# Patient Record
Sex: Male | Born: 1975 | Race: White | Hispanic: No | Marital: Married | Smoking: Current every day smoker
Health system: Southern US, Community
[De-identification: ages and names within clinical notes are randomized; demographics above are authoritative.]

## PROBLEM LIST (undated history)

## (undated) DIAGNOSIS — G8929 Other chronic pain: Secondary | ICD-10-CM

## (undated) DIAGNOSIS — M549 Dorsalgia, unspecified: Secondary | ICD-10-CM

## (undated) DIAGNOSIS — I10 Essential (primary) hypertension: Secondary | ICD-10-CM

## (undated) DIAGNOSIS — F1910 Other psychoactive substance abuse, uncomplicated: Secondary | ICD-10-CM

## (undated) DIAGNOSIS — F419 Anxiety disorder, unspecified: Secondary | ICD-10-CM

---

## 2010-08-30 ENCOUNTER — Emergency Department (HOSPITAL_BASED_OUTPATIENT_CLINIC_OR_DEPARTMENT_OTHER): Admission: EM | Admit: 2010-08-30 | Discharge: 2010-08-30 | Payer: Self-pay | Admitting: Emergency Medicine

## 2010-09-01 ENCOUNTER — Emergency Department (HOSPITAL_BASED_OUTPATIENT_CLINIC_OR_DEPARTMENT_OTHER): Admission: EM | Admit: 2010-09-01 | Discharge: 2010-09-02 | Payer: Self-pay | Admitting: Emergency Medicine

## 2010-09-11 ENCOUNTER — Emergency Department (HOSPITAL_BASED_OUTPATIENT_CLINIC_OR_DEPARTMENT_OTHER): Admission: EM | Admit: 2010-09-11 | Discharge: 2010-09-11 | Payer: Self-pay | Admitting: Emergency Medicine

## 2011-01-22 ENCOUNTER — Emergency Department (HOSPITAL_BASED_OUTPATIENT_CLINIC_OR_DEPARTMENT_OTHER)
Admission: EM | Admit: 2011-01-22 | Discharge: 2011-01-22 | Payer: Self-pay | Source: Home / Self Care | Admitting: Emergency Medicine

## 2011-03-01 ENCOUNTER — Emergency Department (HOSPITAL_BASED_OUTPATIENT_CLINIC_OR_DEPARTMENT_OTHER)
Admission: EM | Admit: 2011-03-01 | Discharge: 2011-03-01 | Disposition: A | Discharge status: Home / Self Care | Payer: Self-pay | Attending: Emergency Medicine | Admitting: Emergency Medicine

## 2011-03-01 DIAGNOSIS — K029 Dental caries, unspecified: Secondary | ICD-10-CM | POA: Insufficient documentation

## 2011-03-01 DIAGNOSIS — K089 Disorder of teeth and supporting structures, unspecified: Secondary | ICD-10-CM | POA: Insufficient documentation

## 2011-03-12 ENCOUNTER — Emergency Department (HOSPITAL_BASED_OUTPATIENT_CLINIC_OR_DEPARTMENT_OTHER)
Admission: EM | Admit: 2011-03-12 | Discharge: 2011-03-12 | Disposition: A | Discharge status: Home / Self Care | Payer: Self-pay | Attending: Emergency Medicine | Admitting: Emergency Medicine

## 2011-03-12 DIAGNOSIS — R6884 Jaw pain: Secondary | ICD-10-CM | POA: Insufficient documentation

## 2011-03-12 DIAGNOSIS — K1370 Unspecified lesions of oral mucosa: Secondary | ICD-10-CM | POA: Insufficient documentation

## 2011-03-12 DIAGNOSIS — F17200 Nicotine dependence, unspecified, uncomplicated: Secondary | ICD-10-CM | POA: Insufficient documentation

## 2011-05-03 ENCOUNTER — Emergency Department (HOSPITAL_BASED_OUTPATIENT_CLINIC_OR_DEPARTMENT_OTHER)
Admission: EM | Admit: 2011-05-03 | Discharge: 2011-05-03 | Disposition: A | Discharge status: Home / Self Care | Payer: Self-pay | Attending: Emergency Medicine | Admitting: Emergency Medicine

## 2011-05-03 DIAGNOSIS — F17200 Nicotine dependence, unspecified, uncomplicated: Secondary | ICD-10-CM | POA: Insufficient documentation

## 2011-05-03 DIAGNOSIS — Y92009 Unspecified place in unspecified non-institutional (private) residence as the place of occurrence of the external cause: Secondary | ICD-10-CM | POA: Insufficient documentation

## 2011-05-03 DIAGNOSIS — M545 Low back pain, unspecified: Secondary | ICD-10-CM | POA: Insufficient documentation

## 2011-05-03 DIAGNOSIS — X58XXXA Exposure to other specified factors, initial encounter: Secondary | ICD-10-CM | POA: Insufficient documentation

## 2011-05-03 DIAGNOSIS — B360 Pityriasis versicolor: Secondary | ICD-10-CM | POA: Insufficient documentation

## 2011-05-03 DIAGNOSIS — S335XXA Sprain of ligaments of lumbar spine, initial encounter: Secondary | ICD-10-CM | POA: Insufficient documentation

## 2011-07-13 ENCOUNTER — Emergency Department (HOSPITAL_BASED_OUTPATIENT_CLINIC_OR_DEPARTMENT_OTHER)
Admission: EM | Admit: 2011-07-13 | Discharge: 2011-07-13 | Disposition: A | Discharge status: Home / Self Care | Payer: Medicaid Other | Attending: Emergency Medicine | Admitting: Emergency Medicine

## 2011-07-13 ENCOUNTER — Encounter: Payer: Self-pay | Admitting: Emergency Medicine

## 2011-07-13 DIAGNOSIS — T148XXA Other injury of unspecified body region, initial encounter: Secondary | ICD-10-CM

## 2011-07-13 DIAGNOSIS — F17200 Nicotine dependence, unspecified, uncomplicated: Secondary | ICD-10-CM | POA: Insufficient documentation

## 2011-07-13 DIAGNOSIS — S335XXA Sprain of ligaments of lumbar spine, initial encounter: Secondary | ICD-10-CM | POA: Insufficient documentation

## 2011-07-13 DIAGNOSIS — Y9311 Activity, swimming: Secondary | ICD-10-CM | POA: Insufficient documentation

## 2011-07-13 DIAGNOSIS — X500XXA Overexertion from strenuous movement or load, initial encounter: Secondary | ICD-10-CM | POA: Insufficient documentation

## 2011-07-13 HISTORY — DX: Dorsalgia, unspecified: M54.9

## 2011-07-13 MED ORDER — CYCLOBENZAPRINE HCL 10 MG PO TABS: 10.0000 mg | ORAL_TABLET | Freq: Two times a day (BID) | ORAL | Status: AC | PRN

## 2011-07-13 MED ORDER — IBUPROFEN 800 MG PO TABS: 800.0000 mg | ORAL_TABLET | Freq: Three times a day (TID) | ORAL | Status: AC

## 2011-07-13 MED ORDER — HYDROCODONE-ACETAMINOPHEN 5-325 MG PO TABS: 1.0000 | ORAL_TABLET | Freq: Four times a day (QID) | ORAL | Status: AC | PRN

## 2011-07-13 NOTE — ED Notes (Signed)
No PCP 

## 2011-07-13 NOTE — ED Provider Notes (Signed)
History      Chief Complaint  Patient presents with  . Back Pain  Reports going swimming all day yesterday. States his left lower back has been spasming since then. Reports similar symptoms in the past.  Patient is a 35 y.o. male presenting with back pain. The history is provided by the patient.  Back Pain  This is a new problem. Episode onset: yesterday. The problem occurs constantly. The problem has been gradually worsening. The pain is associated with no known injury. Pain location: Right left lower back. The quality of the pain is described as aching. The pain does not radiate. The pain is moderate. The symptoms are aggravated by twisting (palpation). Pertinent negatives include no chest pain, no numbness, no abdominal pain, no bowel incontinence, no perianal numbness, no bladder incontinence, no dysuria, no leg pain, no paresthesias, no paresis, no tingling and no weakness. He has tried nothing for the symptoms.    Past Medical History  Diagnosis Date  . Back pain     History reviewed. No pertinent past surgical history.  No family history on file.  History  Substance Use Topics  . Smoking status: Current Some Day Smoker  . Smokeless tobacco: Not on file  . Alcohol Use: No      Review of Systems  Cardiovascular: Negative for chest pain.  Gastrointestinal: Negative for abdominal pain and bowel incontinence.  Genitourinary: Negative for bladder incontinence and dysuria.  Musculoskeletal: Positive for back pain.  Neurological: Negative for tingling, weakness, numbness and paresthesias.  All other systems reviewed and are negative.    Physical Exam  BP 138/71  Pulse 75  Temp(Src) 98.8 F (37.1 C) (Oral)  Resp 16  SpO2 100%  Physical Exam  Constitutional: He is oriented to person, place, and time. He appears well-developed and well-nourished.  HENT:  Head: Normocephalic and atraumatic.  Eyes: Conjunctivae are normal. Pupils are equal, round, and reactive to light.   Neck: Normal range of motion. Neck supple.  Cardiovascular: Normal rate, regular rhythm and normal heart sounds.   Pulmonary/Chest: Effort normal and breath sounds normal.  Abdominal: Soft. Bowel sounds are normal. There is no tenderness. There is no rebound and no guarding.  Neurological: He is alert and oriented to person, place, and time.  Skin: Skin is warm and dry. No rash noted. No erythema. No pallor.  Psychiatric: He has a normal mood and affect. His behavior is normal.  Spine: no midline tenderness. Neg SLR. No lumbar paraspinal tenderness. Pain with palpation of left lower lumbar area.   ED Course  Procedures  MDM      Thomasene Lot, [place] 07/13/11 1443  Evaluation and management procedures were performed by the mid-level provider (PA/NP/CNM) under my supervision/collaboration.   Vida Roller, MD 07/13/11 [phone]

## 2011-08-01 ENCOUNTER — Emergency Department (HOSPITAL_BASED_OUTPATIENT_CLINIC_OR_DEPARTMENT_OTHER)
Admission: EM | Admit: 2011-08-01 | Discharge: 2011-08-01 | Disposition: A | Discharge status: Home / Self Care | Payer: Medicaid Other | Attending: Emergency Medicine | Admitting: Emergency Medicine

## 2011-08-01 ENCOUNTER — Encounter (HOSPITAL_BASED_OUTPATIENT_CLINIC_OR_DEPARTMENT_OTHER): Payer: Self-pay | Admitting: *Deleted

## 2011-08-01 DIAGNOSIS — M549 Dorsalgia, unspecified: Secondary | ICD-10-CM | POA: Insufficient documentation

## 2011-08-01 DIAGNOSIS — K047 Periapical abscess without sinus: Secondary | ICD-10-CM

## 2011-08-01 MED ORDER — PENICILLIN V POTASSIUM 500 MG PO TABS: 500.0000 mg | ORAL_TABLET | Freq: Four times a day (QID) | ORAL | Status: AC

## 2011-08-01 MED ORDER — HYDROCODONE-ACETAMINOPHEN 7.5-500 MG/15ML PO SOLN: ORAL | Status: DC

## 2011-08-01 MED ORDER — BUPIVACAINE HCL 0.25 % IJ SOLN
5.0000 mL | Freq: Once | INTRAMUSCULAR | Status: AC
  Administered 2011-08-01: 5 mL

## 2011-08-01 MED ORDER — IBUPROFEN 800 MG PO TABS: 800.0000 mg | ORAL_TABLET | Freq: Three times a day (TID) | ORAL | Status: AC

## 2011-08-01 NOTE — ED Notes (Signed)
Dental pain. Left lower molar pain.

## 2011-08-01 NOTE — ED Provider Notes (Signed)
History     CSN: [id number] Arrival date & time: 08/01/2011  6:48 PM  Chief Complaint  Patient presents with  . Dental Pain   Patient is a 35 y.o. male presenting with tooth pain. The history is provided by the patient.  Dental PainThe primary symptoms include mouth pain. Primary symptoms do not include dental injury, headaches, fever or shortness of breath. The symptoms began yesterday. The symptoms are worsening. The symptoms are new. The symptoms occur constantly.  Additional symptoms do not include: trismus, facial swelling, trouble swallowing, pain with swallowing, excessive salivation, drooling, ear pain, nosebleeds and swollen glands. Medical issues include: smoking.  L l ower molar pain states he has a DDS appointment in one week. No F/C, No N/V.   Past Medical History  Diagnosis Date  . Back pain     History reviewed. No pertinent past surgical history.  History reviewed. No pertinent family history.  History  Substance Use Topics  . Smoking status: Current Some Day Smoker  . Smokeless tobacco: Not on file  . Alcohol Use: No      Review of Systems  Constitutional: Negative for fever and chills.  HENT: Negative for ear pain, nosebleeds, facial swelling, drooling, trouble swallowing, neck pain and neck stiffness.   Eyes: Negative for pain.  Respiratory: Negative for shortness of breath.   Cardiovascular: Negative for chest pain.  Gastrointestinal: Negative for abdominal pain.  Genitourinary: Negative for dysuria.  Musculoskeletal: Negative for back pain.  Skin: Negative for rash.  Neurological: Negative for headaches.  All other systems reviewed and are negative.    Physical Exam  BP 128/71  Pulse 71  Temp(Src) 99.5 F (37.5 C) (Oral)  Resp 17  Ht 5\' 10"  (1.778 m)  Wt 145 lb (65.772 kg)  BMI 20.81 kg/m2  SpO2 98%  Physical Exam  Constitutional: He is oriented to person, place, and time. He appears well-developed and well-nourished.  HENT:  Head:  Normocephalic and atraumatic.       TTP L 3rd molar, no gingival swelling or fluctuance, no facial swelling or erythema, no trismus  Eyes: Conjunctivae and EOM are normal. Pupils are equal, round, and reactive to light.  Neck: Trachea normal and full passive range of motion without pain. Neck supple. No thyromegaly present.       No meningismus  Cardiovascular: Normal rate, regular rhythm, S1 normal, S2 normal, intact distal pulses and normal pulses.     No systolic murmur is present   No diastolic murmur is present  Pulses:      Radial pulses are 2+ on the right side, and 2+ on the left side.  Pulmonary/Chest: Effort normal and breath sounds normal. He has no wheezes. He has no rhonchi. He has no rales. He exhibits no tenderness.  Abdominal: Soft. Normal appearance and bowel sounds are normal. There is no tenderness. There is no CVA tenderness and negative Murphy's sign.  Musculoskeletal: Normal range of motion.       BLE:s Calves nontender, no cords or erythema, negative Homans sign  Neurological: He is alert and oriented to person, place, and time. He has normal strength and normal reflexes. No cranial nerve deficit or sensory deficit. He displays a negative Romberg sign. GCS eye subscore is 4. GCS verbal subscore is 5. GCS motor subscore is 6.       Normal Gait  Skin: Skin is warm and dry. No rash noted. He is not diaphoretic. No cyanosis. Nails show no clubbing.  Psychiatric: He has  a normal mood and affect. His speech is normal and behavior is normal.       Cooperative and appropriate    ED Course  Dental Date/Time: 08/01/2011 7:59 PM Performed by: Sunnie Nielsen Authorized by: Sunnie Nielsen Consent: Verbal consent obtained. Risks and benefits: risks, benefits and alternatives were discussed Consent given by: patient Patient understanding: patient states understanding of the procedure being performed Time out: Immediately prior to procedure a "time out" was called to verify the  correct patient, procedure, equipment, support staff and site/side marked as required. Local anesthesia used: yes Anesthesia: local infiltration Local anesthetic: bupivacaine 0.25% without epinephrine Anesthetic total: 2 ml Patient tolerance: Patient tolerated the procedure well with no immediate complications.    MDM Dental pain possible abscess, treated for same with local injection, RX pain meds and ABx      Sunnie Nielsen, MD 08/01/11 2000

## 2011-10-05 ENCOUNTER — Encounter (HOSPITAL_BASED_OUTPATIENT_CLINIC_OR_DEPARTMENT_OTHER): Payer: Self-pay | Admitting: Emergency Medicine

## 2011-10-05 ENCOUNTER — Emergency Department (HOSPITAL_BASED_OUTPATIENT_CLINIC_OR_DEPARTMENT_OTHER)
Admission: EM | Admit: 2011-10-05 | Discharge: 2011-10-05 | Disposition: A | Discharge status: Home / Self Care | Payer: Medicaid Other | Attending: Emergency Medicine | Admitting: Emergency Medicine

## 2011-10-05 DIAGNOSIS — S335XXA Sprain of ligaments of lumbar spine, initial encounter: Secondary | ICD-10-CM | POA: Insufficient documentation

## 2011-10-05 DIAGNOSIS — IMO0002 Reserved for concepts with insufficient information to code with codable children: Secondary | ICD-10-CM | POA: Insufficient documentation

## 2011-10-05 DIAGNOSIS — S39012A Strain of muscle, fascia and tendon of lower back, initial encounter: Secondary | ICD-10-CM

## 2011-10-05 DIAGNOSIS — F17200 Nicotine dependence, unspecified, uncomplicated: Secondary | ICD-10-CM | POA: Insufficient documentation

## 2011-10-05 MED ORDER — METHOCARBAMOL 500 MG PO TABS: 500.0000 mg | ORAL_TABLET | Freq: Two times a day (BID) | ORAL | Status: AC

## 2011-10-05 MED ORDER — HYDROCODONE-ACETAMINOPHEN 5-325 MG PO TABS: 2.0000 | ORAL_TABLET | ORAL | Status: AC | PRN

## 2011-10-05 NOTE — ED Provider Notes (Signed)
History     CSN: [id number] Arrival date & time: 10/05/2011  9:11 AM  Chief Complaint  Patient presents with  . Back Pain    Pt report hittin lower back on cross beam abrasion to site with muscle spasms    (Consider location/radiation/quality/duration/timing/severity/associated sxs/prior treatment) Patient is a 35 y.o. male presenting with back pain. The history is provided by the patient.  Back Pain    patient injured his back 3 days ago on the left flank area when he struck an object. Denies any weakness in his legs no hematuria. Symptoms are nonradiating worse when he moves around better with rest. Has used Motrin at home without relief of his symptoms. Does have a prior history of back injury. Denies any spinal tenderness  Past Medical History  Diagnosis Date  . Back pain     History reviewed. No pertinent past surgical history.  History reviewed. No pertinent family history.  History  Substance Use Topics  . Smoking status: Current Some Day Smoker  . Smokeless tobacco: Not on file  . Alcohol Use: No      Review of Systems  Musculoskeletal: Positive for back pain.  All other systems reviewed and are negative.    Allergies  Review of patient's allergies indicates no known allergies.  Home Medications   Current Outpatient Rx  Name Route Sig Dispense Refill  . HYDROCODONE-ACETAMINOPHEN 7.5-500 MG/15ML PO SOLN  7.70ml PO QHS as needed for severe pain. No driving while medicated. 60 mL 0  . IBUPROFEN 200 MG PO TABS Oral Take 600 mg by mouth 2 (two) times daily as needed. For pain        BP 127/70  Pulse 65  Temp 97.6 F (36.4 C)  Resp 22  SpO2 100%  Physical Exam  Nursing note and vitals reviewed. Constitutional: He is oriented to person, place, and time. Vital signs are normal. He appears well-developed and well-nourished.  Non-toxic appearance.  HENT:  Head: Normocephalic and atraumatic.  Eyes: Conjunctivae are normal. Pupils are equal, round, and  reactive to light.  Neck: Normal range of motion.  Cardiovascular: Normal rate.   Pulmonary/Chest: Effort normal.  Musculoskeletal:       Right shoulder: He exhibits tenderness, swelling, pain and spasm. He exhibits no bony tenderness.       Arms: Neurological: He is alert and oriented to person, place, and time.  Skin: Skin is warm and dry.  Psychiatric: He has a normal mood and affect.    ED Course  Procedures (including critical care time)  Labs Reviewed - No data to display No results found.   No diagnosis found.    MDM  Patient to be treated with pain meds and muscle relaxants and followup with his doctors in the        Toy Baker, MD 10/05/11 [phone]

## 2011-10-05 NOTE — ED Notes (Signed)
Care Plan reviewed with pt and safe use of use.

## 2011-10-05 NOTE — ED Notes (Signed)
Pt reports lower back pain after injury on Thursday

## 2012-03-09 ENCOUNTER — Emergency Department (HOSPITAL_BASED_OUTPATIENT_CLINIC_OR_DEPARTMENT_OTHER)
Admission: EM | Admit: 2012-03-09 | Discharge: 2012-03-09 | Disposition: A | Discharge status: Home / Self Care | Payer: Medicaid Other | Attending: Emergency Medicine | Admitting: Emergency Medicine

## 2012-03-09 ENCOUNTER — Encounter (HOSPITAL_BASED_OUTPATIENT_CLINIC_OR_DEPARTMENT_OTHER): Payer: Self-pay | Admitting: *Deleted

## 2012-03-09 DIAGNOSIS — F17200 Nicotine dependence, unspecified, uncomplicated: Secondary | ICD-10-CM | POA: Insufficient documentation

## 2012-03-09 DIAGNOSIS — F411 Generalized anxiety disorder: Secondary | ICD-10-CM | POA: Insufficient documentation

## 2012-03-09 DIAGNOSIS — F419 Anxiety disorder, unspecified: Secondary | ICD-10-CM

## 2012-03-09 MED ORDER — ONDANSETRON 8 MG PO TBDP
8.0000 mg | ORAL_TABLET | Freq: Once | ORAL | Status: AC
  Administered 2012-03-09: 8 mg via ORAL
  Filled 2012-03-09: qty 1

## 2012-03-09 MED ORDER — LORAZEPAM 1 MG PO TABS: 2.0000 mg | ORAL_TABLET | Freq: Three times a day (TID) | ORAL | Status: AC | PRN

## 2012-03-09 MED ORDER — LORAZEPAM 1 MG PO TABS
2.0000 mg | ORAL_TABLET | Freq: Once | ORAL | Status: AC
  Administered 2012-03-09: 2 mg via ORAL
  Filled 2012-03-09: qty 2

## 2012-03-09 NOTE — ED Provider Notes (Signed)
History     CSN: [id number]  Arrival date & time 03/09/12  7829   First MD Initiated Contact with Patient 03/09/12 1956      Chief Complaint  Patient presents with  . Panic Attack    (Consider location/radiation/quality/duration/timing/severity/associated sxs/prior treatment) Patient is a 36 y.o. male presenting with anxiety. The history is provided by the patient.  Anxiety This is a new problem. The current episode started more than 2 days ago. The problem occurs constantly. The problem has been gradually worsening. Pertinent negatives include no chest pain, no abdominal pain and no headaches. Associated symptoms comments: Not sleeping, 19, abdominal burning. Has been extremely stressed since his wife was in the hospital progressed with his and his having to watch the children with no help. The symptoms are aggravated by stress. The symptoms are relieved by nothing. He has tried nothing for the symptoms. The treatment provided no relief.    Past Medical History  Diagnosis Date  . Back pain     History reviewed. No pertinent past surgical history.  History reviewed. No pertinent family history.  History  Substance Use Topics  . Smoking status: Current Some Day Smoker  . Smokeless tobacco: Not on file  . Alcohol Use: No      Review of Systems  Cardiovascular: Negative for chest pain.  Gastrointestinal: Negative for abdominal pain.  Neurological: Negative for headaches.  All other systems reviewed and are negative.    Allergies  Review of patient's allergies indicates no known allergies.  Home Medications   Current Outpatient Rx  Name Route Sig Dispense Refill  . IBUPROFEN 200 MG PO TABS Oral Take 200 mg by mouth 2 (two) times daily as needed. For pain     . OVER THE COUNTER MEDICATION Oral Take 2 tablets by mouth once as needed. Nasal congestion tablets      BP 106/83  Pulse 73  Temp(Src) 98.7 F (37.1 C) (Oral)  Resp 16  SpO2 100%  Physical Exam    Nursing note and vitals reviewed. Constitutional: He is oriented to person, place, and time. He appears well-developed and well-nourished. No distress.  HENT:  Head: Normocephalic and atraumatic.  Mouth/Throat: Oropharynx is clear and moist.  Eyes: Conjunctivae and EOM are normal. Pupils are equal, round, and reactive to light.  Neck: Normal range of motion. Neck supple.  Cardiovascular: Normal rate, regular rhythm and intact distal pulses.   No murmur heard. Pulmonary/Chest: Effort normal and breath sounds normal. No respiratory distress. He has no wheezes. He has no rales.  Abdominal: Soft. He exhibits no distension. There is no tenderness. There is no rebound and no guarding.  Musculoskeletal: Normal range of motion. He exhibits no edema and no tenderness.  Neurological: He is alert and oriented to person, place, and time.  Skin: Skin is warm and dry. No rash noted. No erythema.  Psychiatric: His behavior is normal. His mood appears anxious. He is not actively hallucinating. Thought content is not paranoid. He expresses no homicidal and no suicidal ideation.    ED Course  Procedures (including critical care time)  Labs Reviewed - No data to display No results found.   No diagnosis found.    MDM  Patient with generalized anxiety due to stress because of his wife's recent illness. He states that he is not eating or sleeping and is very terrible. He has a history of anxiety but does not take anything chronically for it. He denies suicide or homicide. He is not hallucinating.  After Ativan patient feels much better wants to go home.        Gwyneth Sprout, MD 03/09/12 2100

## 2012-03-09 NOTE — ED Notes (Signed)
Patient states that his wife has been admitted to intensive care and that he is having a panic attack, feels stressed and hasn't been able to sleep or eat for the past two days, states that he broke down and cried today. States he needs to sleep

## 2012-04-02 ENCOUNTER — Encounter (HOSPITAL_BASED_OUTPATIENT_CLINIC_OR_DEPARTMENT_OTHER): Payer: Self-pay | Admitting: Family Medicine

## 2012-04-02 ENCOUNTER — Emergency Department (HOSPITAL_BASED_OUTPATIENT_CLINIC_OR_DEPARTMENT_OTHER)
Admission: EM | Admit: 2012-04-02 | Discharge: 2012-04-02 | Disposition: A | Discharge status: Home / Self Care | Payer: Medicaid Other | Attending: Emergency Medicine | Admitting: Emergency Medicine

## 2012-04-02 DIAGNOSIS — I10 Essential (primary) hypertension: Secondary | ICD-10-CM | POA: Insufficient documentation

## 2012-04-02 DIAGNOSIS — F17200 Nicotine dependence, unspecified, uncomplicated: Secondary | ICD-10-CM | POA: Insufficient documentation

## 2012-04-02 DIAGNOSIS — F411 Generalized anxiety disorder: Secondary | ICD-10-CM | POA: Insufficient documentation

## 2012-04-02 DIAGNOSIS — G4700 Insomnia, unspecified: Secondary | ICD-10-CM | POA: Insufficient documentation

## 2012-04-02 DIAGNOSIS — F419 Anxiety disorder, unspecified: Secondary | ICD-10-CM

## 2012-04-02 HISTORY — DX: Essential (primary) hypertension: I10

## 2012-04-02 MED ORDER — LORAZEPAM 1 MG PO TABS
2.0000 mg | ORAL_TABLET | Freq: Once | ORAL | Status: AC
  Administered 2012-04-02: 2 mg via ORAL
  Filled 2012-04-02: qty 2

## 2012-04-02 MED ORDER — LORAZEPAM 1 MG PO TABS: 2.0000 mg | ORAL_TABLET | Freq: Three times a day (TID) | ORAL | Status: AC | PRN

## 2012-04-02 MED ORDER — LORAZEPAM 1 MG PO TABS: 1.0000 mg | ORAL_TABLET | Freq: Once | ORAL | Status: DC

## 2012-04-02 NOTE — ED Provider Notes (Signed)
History     CSN: [id number]  Arrival date & time 04/02/12  1308   First MD Initiated Contact with Patient 04/02/12 1335      Chief Complaint  Patient presents with  . Insomnia    (Consider location/radiation/quality/duration/timing/severity/associated sxs/prior treatment) HPI Comments: His had difficulty sleeping for the past 4-5 days. Has tried Benadryl, Seroquel without improvement. Had to stop taking trazodone secondary to side effects. He is currently on Suboxone for opiate use. Sent here from primary care physician for treatment. He has no suicidal or homicidal ideation. No additional symptoms to suggest mania.  Patient is a 36 y.o. male presenting with anxiety. The history is provided by the patient. No language interpreter was used.  Anxiety This is a new problem. Episode onset: 5 days ago. The problem occurs constantly. The problem has been gradually worsening. Pertinent negatives include no chest pain, no abdominal pain, no headaches and no shortness of breath. The symptoms are aggravated by nothing. The symptoms are relieved by nothing.    Past Medical History  Diagnosis Date  . Back pain   . Hypertension     History reviewed. No pertinent past surgical history.  No family history on file.  History  Substance Use Topics  . Smoking status: Current Some Day Smoker  . Smokeless tobacco: Not on file  . Alcohol Use: No      Review of Systems  Constitutional: Negative for fever, activity change, appetite change and fatigue.  HENT: Negative for congestion, sore throat, rhinorrhea, neck pain and neck stiffness.   Respiratory: Negative for cough and shortness of breath.   Cardiovascular: Negative for chest pain and palpitations.  Gastrointestinal: Negative for nausea, vomiting and abdominal pain.  Genitourinary: Negative for dysuria, urgency, frequency and flank pain.  Musculoskeletal: Negative for myalgias, back pain and arthralgias.  Neurological: Negative for  dizziness, weakness, light-headedness, numbness and headaches.  Psychiatric/Behavioral: Positive for sleep disturbance. Negative for suicidal ideas, hallucinations, confusion and self-injury. The patient is nervous/anxious.   All other systems reviewed and are negative.    Allergies  Review of patient's allergies indicates no known allergies.  Home Medications   Current Outpatient Rx  Name Route Sig Dispense Refill  . CLONIDINE HCL 0.1 MG PO TABS Oral Take 0.1 mg by mouth 2 (two) times daily.    . QUETIAPINE FUMARATE 300 MG PO TABS Oral Take 300 mg by mouth at bedtime.    Marland Kitchen UNABLE TO FIND  Suboxen 2mg     . IBUPROFEN 200 MG PO TABS Oral Take 200 mg by mouth 2 (two) times daily as needed. For pain     . LORAZEPAM 1 MG PO TABS Oral Take 2 tablets (2 mg total) by mouth 3 (three) times daily as needed for anxiety. 18 tablet 0  . OVER THE COUNTER MEDICATION Oral Take 2 tablets by mouth once as needed. Nasal congestion tablets      BP 149/84  Pulse 102  Temp(Src) 97.9 F (36.6 C) (Oral)  Resp 20  SpO2 98%  Physical Exam  Nursing note and vitals reviewed. Constitutional: He is oriented to person, place, and time. He appears well-developed and well-nourished. No distress.  HENT:  Head: Normocephalic and atraumatic.  Mouth/Throat: Oropharynx is clear and moist.  Eyes: Conjunctivae and EOM are normal. Pupils are equal, round, and reactive to light.  Neck: Normal range of motion. Neck supple.  Cardiovascular: Normal rate, normal heart sounds and intact distal pulses.  Exam reveals no gallop and no friction rub.  No murmur heard. Pulmonary/Chest: Effort normal and breath sounds normal. No respiratory distress. He exhibits no tenderness.  Abdominal: Soft. Bowel sounds are normal. There is no tenderness.  Musculoskeletal: Normal range of motion. He exhibits no tenderness.  Neurological: He is alert and oriented to person, place, and time. No cranial nerve deficit.  Skin: Skin is warm and  dry. No rash noted.  Psychiatric: His behavior is normal. His mood appears anxious. His speech is not rapid and/or pressured. He does not exhibit a depressed mood. He expresses no homicidal and no suicidal ideation.    ED Course  Procedures (including critical care time)  Labs Reviewed - No data to display No results found.   1. Anxiety   2. Insomnia       MDM  Insomnia likely secondary to anxiety. He is currently going through treatment for polysubstance abuse. Seroquel and trazodone without relief. Will be provided Ativan as this has helped in the past. Will be seeing his primary care physician on Monday for definitive plan. He has no thoughts of self harm or harm of others. Will be discharged home        Dayton Bailiff, MD 04/02/12 1353

## 2012-04-02 NOTE — ED Notes (Signed)
Pt sts he is unable to sleep since Monday. Pt sts he had to stop taking Trazodone last Sunday due to being in rehab. Pt sts he is at the [facility] as is followed by Dr. Mila Homer. Pt took first Seroquel 300mg  today and sts "it isn't helping". Pt denies SI/HI.

## 2012-04-02 NOTE — Discharge Instructions (Signed)
Insomnia Insomnia is frequent trouble falling and/or staying asleep. Insomnia can be a long term problem or a short term problem. Both are common. Insomnia can be a short term problem when the wakefulness is related to a certain stress or worry. Long term insomnia is often related to ongoing stress during waking hours and/or poor sleeping habits. Overtime, sleep deprivation itself can make the problem worse. Every little thing feels more severe because you are overtired and your ability to cope is decreased. CAUSES   Stress, anxiety, and depression.   Poor sleeping habits.   Distractions such as TV in the bedroom.   Naps close to bedtime.   Engaging in emotionally charged conversations before bed.   Technical reading before sleep.   Alcohol and other sedatives. They may make the problem worse. They can hurt normal sleep patterns and normal dream activity.   Stimulants such as caffeine for several hours prior to bedtime.   Pain syndromes and shortness of breath can cause insomnia.   Exercise late at night.   Changing time zones may cause sleeping problems (jet lag).  It is sometimes helpful to have someone observe your sleeping patterns. They should look for periods of not breathing during the night (sleep apnea). They should also look to see how long those periods last. If you live alone or observers are uncertain, you can also be observed at a sleep clinic where your sleep patterns will be professionally monitored. Sleep apnea requires a checkup and treatment. Give your caregivers your medical history. Give your caregivers observations your family has made about your sleep.  SYMPTOMS   Not feeling rested in the morning.   Anxiety and restlessness at bedtime.   Difficulty falling and staying asleep.  TREATMENT   Your caregiver may prescribe treatment for an underlying medical disorders. Your caregiver can give advice or help if you are using alcohol or other drugs for  self-medication. Treatment of underlying problems will usually eliminate insomnia problems.   Medications can be prescribed for short time use. They are generally not recommended for lengthy use.   Over-the-counter sleep medicines are not recommended for lengthy use. They can be habit forming.   You can promote easier sleeping by making lifestyle changes such as:   Using relaxation techniques that help with breathing and reduce muscle tension.   Exercising earlier in the day.   Changing your diet and the time of your last meal. No night time snacks.   Establish a regular time to go to bed.   Counseling can help with stressful problems and worry.   Soothing music and white noise may be helpful if there are background noises you cannot remove.   Stop tedious detailed work at least one hour before bedtime.  HOME CARE INSTRUCTIONS   Keep a diary. Inform your caregiver about your progress. This includes any medication side effects. See your caregiver regularly. Take note of:   Times when you are asleep.   Times when you are awake during the night.   The quality of your sleep.   How you feel the next day.  This information will help your caregiver care for you.  Get out of bed if you are still awake after 15 minutes. Read or do some quiet activity. Keep the lights down. Wait until you feel sleepy and go back to bed.   Keep regular sleeping and waking hours. Avoid naps.   Exercise regularly.   Avoid distractions at bedtime. Distractions include watching television or engaging   in any intense or detailed activity like attempting to balance the household checkbook.   Develop a bedtime ritual. Keep a familiar routine of bathing, brushing your teeth, climbing into bed at the same time each night, listening to soothing music. Routines increase the success of falling to sleep faster.   Use relaxation techniques. This can be using breathing and muscle tension release routines. It can  also include visualizing peaceful scenes. You can also help control troubling or intruding thoughts by keeping your mind occupied with boring or repetitive thoughts like the old concept of counting sheep. You can make it more creative like imagining planting one beautiful flower after another in your backyard garden.   During your day, work to eliminate stress. When this is not possible use some of the previous suggestions to help reduce the anxiety that accompanies stressful situations.  MAKE SURE YOU:   Understand these instructions.   Will watch your condition.   Will get help right away if you are not doing well or get worse.  Document Released: 12/12/2000 Document Revised: 12/04/2011 Document Reviewed: 01/12/2008 [facility] Patient Information 2012 [place], [place].  Anxiety and Panic Attacks Your caregiver has informed you that you are having an anxiety or panic attack. There may be many forms of this. Most of the time these attacks come suddenly and without warning. They come at any time of day, including periods of sleep, and at any time of life. They may be strong and unexplained. Although panic attacks are very scary, they are physically harmless. Sometimes the cause of your anxiety is not known. Anxiety is a protective mechanism of the body in its fight or flight mechanism. Most of these perceived danger situations are actually nonphysical situations (such as anxiety over losing a job). CAUSES  The causes of an anxiety or panic attack are many. Panic attacks may occur in otherwise healthy people given a certain set of circumstances. There may be a genetic cause for panic attacks. Some medications may also have anxiety as a side effect. SYMPTOMS  Some of the most common feelings are:  Intense terror.   Dizziness, feeling faint.   Hot and cold flashes.   Fear of going crazy.   Feelings that nothing is real.   Sweating.   Shaking.   Chest pain or a fast heartbeat  (palpitations).   Smothering, choking sensations.   Feelings of impending doom and that death is near.   Tingling of extremities, this may be from over-breathing.   Altered reality (derealization).   Being detached from yourself (depersonalization).  Several symptoms can be present to make up anxiety or panic attacks. DIAGNOSIS  The evaluation by your caregiver will depend on the type of symptoms you are experiencing. The diagnosis of anxiety or panic attack is made when no physical illness can be determined to be a cause of the symptoms. TREATMENT  Treatment to prevent anxiety and panic attacks may include:  Avoidance of circumstances that cause anxiety.   Reassurance and relaxation.   Regular exercise.   Relaxation therapies, such as yoga.   Psychotherapy with a psychiatrist or therapist.   Avoidance of caffeine, alcohol and illegal drugs.   Prescribed medication.  SEEK IMMEDIATE MEDICAL CARE IF:   You experience panic attack symptoms that are different than your usual symptoms.   You have any worsening or concerning symptoms.  Document Released: 12/15/2005 Document Revised: 12/04/2011 Document Reviewed: 04/18/2010 [facility] Patient Information 2012 [place], [place].

## 2012-05-03 ENCOUNTER — Emergency Department (HOSPITAL_BASED_OUTPATIENT_CLINIC_OR_DEPARTMENT_OTHER)
Admission: EM | Admit: 2012-05-03 | Discharge: 2012-05-03 | Disposition: A | Discharge status: Home / Self Care | Payer: Medicaid Other | Attending: Emergency Medicine | Admitting: Emergency Medicine

## 2012-05-03 ENCOUNTER — Encounter (HOSPITAL_BASED_OUTPATIENT_CLINIC_OR_DEPARTMENT_OTHER): Payer: Self-pay | Admitting: *Deleted

## 2012-05-03 DIAGNOSIS — F17200 Nicotine dependence, unspecified, uncomplicated: Secondary | ICD-10-CM | POA: Insufficient documentation

## 2012-05-03 DIAGNOSIS — F410 Panic disorder [episodic paroxysmal anxiety] without agoraphobia: Secondary | ICD-10-CM | POA: Insufficient documentation

## 2012-05-03 DIAGNOSIS — G4700 Insomnia, unspecified: Secondary | ICD-10-CM | POA: Insufficient documentation

## 2012-05-03 DIAGNOSIS — I10 Essential (primary) hypertension: Secondary | ICD-10-CM | POA: Insufficient documentation

## 2012-05-03 DIAGNOSIS — F411 Generalized anxiety disorder: Secondary | ICD-10-CM | POA: Insufficient documentation

## 2012-05-03 HISTORY — DX: Other psychoactive substance abuse, uncomplicated: F19.10

## 2012-05-03 MED ORDER — LORAZEPAM 1 MG PO TABS
1.0000 mg | ORAL_TABLET | Freq: Once | ORAL | Status: AC
  Administered 2012-05-03: 1 mg via ORAL
  Filled 2012-05-03: qty 1

## 2012-05-03 NOTE — ED Provider Notes (Signed)
History  This chart was scribed for Cyndra Numbers, MD by Bennett Scrape. This patient was seen in room MH07/MH07 and the patient's care was started at 5:30PM.  CSN: [id number]  Arrival date & time 05/03/12  1712   First MD Initiated Contact with Patient 05/03/12 1730      Chief Complaint  Patient presents with  . Anxiety    The history is provided by the patient. No language interpreter was used.    Chad Steele is a 36 y.o. male who presents to the Emergency Department complaining of one day of anxiety due to problems at home. Patient also reports insomia-hasn't slept in 2 days. He does have an appointment with psychiatrist on Thursday. Patient feels like he is having a  "nervous breakdown" and endorses suicidal thoughts without plan at home but denies any current SI.  He also reports crying spells, denies hallucinations,  endorses taking hisesuboxen today, reports that he went to NA last night, and denies drug use. Per nursing, the patient's mother-in-law the patient has been doing crack all week.  Past Medical History  Diagnosis Date  . Back pain   . Hypertension   . Substance abuse     History reviewed. No pertinent past surgical history.  History reviewed. No pertinent family history.  History  Substance Use Topics  . Smoking status: Current Some Day Smoker  . Smokeless tobacco: Not on file  . Alcohol Use: No      Review of Systems  Constitutional: Negative.   HENT: Negative.   Eyes: Negative.   Respiratory: Negative.   Cardiovascular: Negative.   Gastrointestinal: Negative.   Genitourinary: Negative.   Musculoskeletal: Negative.   Neurological: Negative.   Hematological: Negative.   Psychiatric/Behavioral:       See HPI    Allergies  Review of patient's allergies indicates no known allergies.  Home Medications   Current Outpatient Rx  Name Route Sig Dispense Refill  . UNABLE TO FIND  Suboxen 2mg       Triage Vitals: BP 136/78  Pulse 100  Temp 98.6  F (37 C)  Resp 16  Ht 5\' 11"  (1.803 m)  Wt 140 lb (63.504 kg)  BMI 19.53 kg/m2  SpO2 98%  Physical Exam  Nursing note and vitals reviewed. Constitutional: He is oriented to person, place, and time. He appears well-developed and well-nourished.       Pt is tearful  HENT:  Head: Normocephalic and atraumatic.  Eyes: Conjunctivae and EOM are normal.  Neck: Normal range of motion. Neck supple.  Cardiovascular: Normal rate, regular rhythm and normal heart sounds.   Pulmonary/Chest: Effort normal and breath sounds normal.  Abdominal: Soft. There is no tenderness.  Musculoskeletal: Normal range of motion. He exhibits no edema.  Neurological: He is alert and oriented to person, place, and time.  Skin: Skin is warm and dry.  Psychiatric: His behavior is normal. His mood appears anxious. He expresses no homicidal and no suicidal ideation. He expresses no suicidal plans and no homicidal plans.    ED Course  Procedures (including critical care time)  DIAGNOSTIC STUDIES: Oxygen Saturation is 98% on room air, normal by my interpretation.    COORDINATION OF CARE:    Labs Reviewed - No data to display No results found.   1. Anxiety attack       MDM  Patient was evaluated by myself. Patient reported anxiety consistent with prior anxiety attacks. He was treated with Ativan 1 mg by mouth. Patient was then discharged  home. No prescriptions were given and discharge. Patient did have mother-in-law report frequent substance abuse today the patient denied this. He was hemodynamically stable there are no other concerns today. Patient was discharged in good condition.    I personally performed the services described in this documentation, which was scribed in my presence. The recorded information has been reviewed and considered.        Cyndra Numbers, MD 05/03/12 [phone]

## 2012-05-03 NOTE — ED Notes (Signed)
Pt c/o panic attack x 1 day

## 2012-05-03 NOTE — Discharge Instructions (Signed)

## 2012-05-03 NOTE — ED Notes (Signed)
Pt is in rehab for opiate abuse

## 2012-05-03 NOTE — ED Notes (Signed)
Pt requested rx for anxiety.  MD declined.  Pt stated that he would have to go somewhere else to get some medicine because he would not be able to sleep if he didn't have something.

## 2013-06-22 ENCOUNTER — Encounter (HOSPITAL_BASED_OUTPATIENT_CLINIC_OR_DEPARTMENT_OTHER): Payer: Self-pay | Admitting: *Deleted

## 2013-06-22 ENCOUNTER — Emergency Department (HOSPITAL_BASED_OUTPATIENT_CLINIC_OR_DEPARTMENT_OTHER)
Admission: EM | Admit: 2013-06-22 | Discharge: 2013-06-22 | Disposition: A | Discharge status: Home / Self Care | Payer: Medicaid Other | Attending: Emergency Medicine | Admitting: Emergency Medicine

## 2013-06-22 DIAGNOSIS — F430 Acute stress reaction: Secondary | ICD-10-CM

## 2013-06-22 DIAGNOSIS — Z79899 Other long term (current) drug therapy: Secondary | ICD-10-CM | POA: Insufficient documentation

## 2013-06-22 DIAGNOSIS — I10 Essential (primary) hypertension: Secondary | ICD-10-CM | POA: Insufficient documentation

## 2013-06-22 DIAGNOSIS — F1910 Other psychoactive substance abuse, uncomplicated: Secondary | ICD-10-CM | POA: Insufficient documentation

## 2013-06-22 DIAGNOSIS — F17200 Nicotine dependence, unspecified, uncomplicated: Secondary | ICD-10-CM | POA: Insufficient documentation

## 2013-06-22 DIAGNOSIS — R4589 Other symptoms and signs involving emotional state: Secondary | ICD-10-CM | POA: Insufficient documentation

## 2013-06-22 MED ORDER — LORAZEPAM 1 MG PO TABS
1.0000 mg | ORAL_TABLET | Freq: Once | ORAL | Status: DC
  Filled 2013-06-22: qty 1

## 2013-06-22 NOTE — ED Notes (Signed)
Ativan not to be given until transport home arrives per Charge RN and MD, awaiting arrival, will monitor.
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
## 2013-06-22 NOTE — ED Notes (Signed)
Pt has hx of substance abuse but has been clean x 1 year. Pt states he was dropped off here and has means for transport home.

## 2013-06-22 NOTE — ED Notes (Signed)
MD at bedside. 
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
## 2014-03-15 ENCOUNTER — Emergency Department (HOSPITAL_BASED_OUTPATIENT_CLINIC_OR_DEPARTMENT_OTHER)
Admission: EM | Admit: 2014-03-15 | Discharge: 2014-03-15 | Disposition: A | Discharge status: Home / Self Care | Payer: Medicaid Other | Attending: Emergency Medicine | Admitting: Emergency Medicine

## 2014-03-15 ENCOUNTER — Encounter (HOSPITAL_BASED_OUTPATIENT_CLINIC_OR_DEPARTMENT_OTHER): Payer: Self-pay | Admitting: Emergency Medicine

## 2014-03-15 DIAGNOSIS — I10 Essential (primary) hypertension: Secondary | ICD-10-CM | POA: Insufficient documentation

## 2014-03-15 DIAGNOSIS — IMO0002 Reserved for concepts with insufficient information to code with codable children: Secondary | ICD-10-CM | POA: Insufficient documentation

## 2014-03-15 DIAGNOSIS — F17200 Nicotine dependence, unspecified, uncomplicated: Secondary | ICD-10-CM | POA: Insufficient documentation

## 2014-03-15 DIAGNOSIS — L0291 Cutaneous abscess, unspecified: Secondary | ICD-10-CM

## 2014-03-15 MED ORDER — HYDROCODONE-ACETAMINOPHEN 5-325 MG PO TABS: 2.0000 | ORAL_TABLET | ORAL | Status: DC | PRN

## 2014-03-15 MED ORDER — SULFAMETHOXAZOLE-TRIMETHOPRIM 800-160 MG PO TABS: 2.0000 | ORAL_TABLET | Freq: Two times a day (BID) | ORAL | Status: DC
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
## 2014-03-15 NOTE — ED Notes (Signed)
Pt reports abscess on (R) forearm x 4-5 days.

## 2014-03-15 NOTE — Discharge Instructions (Signed)
°  Remove 2 inches of the gauze in 48 hours. Re check here in 48 hours if not improving, or with any difficulty removing the gauze. Abscess An abscess is an infected area that contains a collection of pus and debris.It can occur in almost any part of the body. An abscess is also known as a furuncle or boil. CAUSES  An abscess occurs when tissue gets infected. This can occur from blockage of oil or sweat glands, infection of hair follicles, or a minor injury to the skin. As the body tries to fight the infection, pus collects in the area and creates pressure under the skin. This pressure causes pain. People with weakened immune systems have difficulty fighting infections and get certain abscesses more often.  SYMPTOMS Usually an abscess develops on the skin and becomes a painful mass that is red, warm, and tender. If the abscess forms under the skin, you may feel a moveable soft area under the skin. Some abscesses break open (rupture) on their own, but most will continue to get worse without care. The infection can spread deeper into the body and eventually into the bloodstream, causing you to feel ill.  DIAGNOSIS  Your caregiver will take your medical history and perform a physical exam. A sample of fluid may also be taken from the abscess to determine what is causing your infection. TREATMENT  Your caregiver may prescribe antibiotic medicines to fight the infection. However, taking antibiotics alone usually does not cure an abscess. Your caregiver may need to make a small cut (incision) in the abscess to drain the pus. In some cases, gauze is packed into the abscess to reduce pain and to continue draining the area. HOME CARE INSTRUCTIONS   Only take over-the-counter or prescription medicines for pain, discomfort, or fever as directed by your caregiver.  If you were prescribed antibiotics, take them as directed. Finish them even if you start to feel better.  If gauze is used, follow your caregiver's  directions for changing the gauze.  To avoid spreading the infection:  Keep your draining abscess covered with a bandage.  Wash your hands well.  Do not share personal care items, towels, or whirlpools with others.  Avoid skin contact with others.  Keep your skin and clothes clean around the abscess.  Keep all follow-up appointments as directed by your caregiver. SEEK MEDICAL CARE IF:   You have increased pain, swelling, redness, fluid drainage, or bleeding.  You have muscle aches, chills, or a general ill feeling.  You have a fever. MAKE SURE YOU:   Understand these instructions.  Will watch your condition.  Will get help right away if you are not doing well or get worse. Document Released: 09/24/2005 Document Revised: 06/15/2012 Document Reviewed: 02/27/2012 [facility] Patient Information 2014 [place], [place]

## 2014-09-12 ENCOUNTER — Emergency Department (HOSPITAL_BASED_OUTPATIENT_CLINIC_OR_DEPARTMENT_OTHER)
Admission: EM | Admit: 2014-09-12 | Discharge: 2014-09-12 | Disposition: A | Discharge status: Home / Self Care | Payer: Medicaid Other | Attending: Emergency Medicine | Admitting: Emergency Medicine

## 2014-09-12 ENCOUNTER — Encounter (HOSPITAL_BASED_OUTPATIENT_CLINIC_OR_DEPARTMENT_OTHER): Payer: Self-pay | Admitting: Emergency Medicine

## 2014-09-12 ENCOUNTER — Emergency Department (HOSPITAL_BASED_OUTPATIENT_CLINIC_OR_DEPARTMENT_OTHER): Payer: Medicaid Other

## 2014-09-12 DIAGNOSIS — F17200 Nicotine dependence, unspecified, uncomplicated: Secondary | ICD-10-CM | POA: Insufficient documentation

## 2014-09-12 DIAGNOSIS — M545 Low back pain, unspecified: Secondary | ICD-10-CM

## 2014-09-12 DIAGNOSIS — Z792 Long term (current) use of antibiotics: Secondary | ICD-10-CM | POA: Insufficient documentation

## 2014-09-12 DIAGNOSIS — Y9389 Activity, other specified: Secondary | ICD-10-CM | POA: Insufficient documentation

## 2014-09-12 DIAGNOSIS — IMO0002 Reserved for concepts with insufficient information to code with codable children: Secondary | ICD-10-CM | POA: Insufficient documentation

## 2014-09-12 DIAGNOSIS — S0990XA Unspecified injury of head, initial encounter: Secondary | ICD-10-CM | POA: Diagnosis not present

## 2014-09-12 DIAGNOSIS — Y92410 Unspecified street and highway as the place of occurrence of the external cause: Secondary | ICD-10-CM | POA: Diagnosis not present

## 2014-09-12 DIAGNOSIS — I10 Essential (primary) hypertension: Secondary | ICD-10-CM | POA: Insufficient documentation

## 2014-09-12 LAB — URINALYSIS, ROUTINE W REFLEX MICROSCOPIC
Bilirubin Urine: NEGATIVE
Glucose, UA: NEGATIVE mg/dL
Hgb urine dipstick: NEGATIVE
Ketones, ur: NEGATIVE mg/dL
LEUKOCYTES UA: NEGATIVE
NITRITE: NEGATIVE
PROTEIN: NEGATIVE mg/dL
SPECIFIC GRAVITY, URINE: 1.027 (ref 1.005–1.030)
UROBILINOGEN UA: 1 mg/dL (ref 0.0–1.0)
pH: 6.5 (ref 5.0–8.0)

## 2014-09-12 MED ORDER — OXYCODONE-ACETAMINOPHEN 5-325 MG PO TABS
1.0000 | ORAL_TABLET | Freq: Once | ORAL | Status: AC
  Administered 2014-09-12: 1 via ORAL
  Filled 2014-09-12: qty 1

## 2014-09-12 MED ORDER — CYCLOBENZAPRINE HCL 5 MG PO TABS: 5.0000 mg | ORAL_TABLET | Freq: Three times a day (TID) | ORAL | Status: DC | PRN

## 2014-09-12 MED ORDER — IBUPROFEN 800 MG PO TABS
800.0000 mg | ORAL_TABLET | Freq: Once | ORAL | Status: AC
  Administered 2014-09-12: 800 mg via ORAL
  Filled 2014-09-12: qty 1

## 2014-09-12 MED ORDER — IBUPROFEN 600 MG PO TABS: 600.0000 mg | ORAL_TABLET | Freq: Four times a day (QID) | ORAL | Status: DC | PRN

## 2014-09-12 MED ORDER — OXYCODONE-ACETAMINOPHEN 5-325 MG PO TABS: 1.0000 | ORAL_TABLET | Freq: Four times a day (QID) | ORAL | Status: DC | PRN

## 2014-09-12 NOTE — Discharge Instructions (Signed)
Return to the ED with any concerns including weakness of legs, not able to urinate, loss of control of bowel or bladder, fever/chills, or any other alarming symptoms °
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
## 2015-06-06 ENCOUNTER — Encounter (HOSPITAL_BASED_OUTPATIENT_CLINIC_OR_DEPARTMENT_OTHER): Payer: Self-pay | Admitting: *Deleted

## 2015-06-06 DIAGNOSIS — Z720 Tobacco use: Secondary | ICD-10-CM | POA: Insufficient documentation

## 2015-06-06 DIAGNOSIS — I10 Essential (primary) hypertension: Secondary | ICD-10-CM | POA: Insufficient documentation

## 2015-06-06 DIAGNOSIS — L02511 Cutaneous abscess of right hand: Secondary | ICD-10-CM | POA: Diagnosis not present

## 2015-06-06 NOTE — ED Notes (Signed)
Pt c/o abscess to hand x 2 days

## 2015-06-07 ENCOUNTER — Emergency Department (HOSPITAL_BASED_OUTPATIENT_CLINIC_OR_DEPARTMENT_OTHER)
Admission: EM | Admit: 2015-06-07 | Discharge: 2015-06-07 | Disposition: A | Discharge status: Home / Self Care | Payer: BLUE CROSS/BLUE SHIELD | Attending: Emergency Medicine | Admitting: Emergency Medicine

## 2015-06-07 DIAGNOSIS — L0291 Cutaneous abscess, unspecified: Secondary | ICD-10-CM

## 2015-06-07 MED ORDER — OXYCODONE-ACETAMINOPHEN 5-325 MG PO TABS: 1.0000 | ORAL_TABLET | Freq: Four times a day (QID) | ORAL | Status: DC | PRN

## 2015-06-07 MED ORDER — OXYCODONE-ACETAMINOPHEN 5-325 MG PO TABS
1.0000 | ORAL_TABLET | Freq: Once | ORAL | Status: AC
Start: 2015-06-07 — End: 2015-06-07
  Administered 2015-06-07: 1 via ORAL
  Filled 2015-06-07: qty 1

## 2015-06-07 MED ORDER — LIDOCAINE-EPINEPHRINE 2 %-1:100000 IJ SOLN
20.0000 mL | Freq: Once | INTRAMUSCULAR | Status: AC
  Administered 2015-06-07: 5 mL
  Filled 2015-06-07: qty 1

## 2015-06-07 MED ORDER — SULFAMETHOXAZOLE-TRIMETHOPRIM 800-160 MG PO TABS: 1.0000 | ORAL_TABLET | Freq: Two times a day (BID) | ORAL | Status: DC

## 2015-06-07 MED ORDER — CEPHALEXIN 500 MG PO CAPS: 500.0000 mg | ORAL_CAPSULE | Freq: Four times a day (QID) | ORAL | Status: DC

## 2015-06-07 MED ORDER — CEPHALEXIN 250 MG PO CAPS
500.0000 mg | ORAL_CAPSULE | Freq: Once | ORAL | Status: AC
  Administered 2015-06-07: 500 mg via ORAL
  Filled 2015-06-07: qty 2

## 2015-06-07 NOTE — ED Notes (Signed)
Discharge is delayed because edp did not sign the prescriptions and is currently not available to sign.

## 2015-06-07 NOTE — Discharge Instructions (Signed)
You were seen today for an abscess of her right hand. There was associated swelling. Because of the swelling, you will be treated for cellulitis as well. It is very important that you monitor your hand closely. If you have increasing redness, pain, swelling, or any new or worsening symptoms, you need to be reevaluated immediately.  Abscess An abscess is an infected area that contains a collection of pus and debris.It can occur in almost any part of the body. An abscess is also known as a furuncle or boil. CAUSES  An abscess occurs when tissue gets infected. This can occur from blockage of oil or sweat glands, infection of hair follicles, or a minor injury to the skin. As the body tries to fight the infection, pus collects in the area and creates pressure under the skin. This pressure causes pain. People with weakened immune systems have difficulty fighting infections and get certain abscesses more often.  SYMPTOMS Usually an abscess develops on the skin and becomes a painful mass that is red, warm, and tender. If the abscess forms under the skin, you may feel a moveable soft area under the skin. Some abscesses break open (rupture) on their own, but most will continue to get worse without care. The infection can spread deeper into the body and eventually into the bloodstream, causing you to feel ill.  DIAGNOSIS  Your caregiver will take your medical history and perform a physical exam. A sample of fluid may also be taken from the abscess to determine what is causing your infection. TREATMENT  Your caregiver may prescribe antibiotic medicines to fight the infection. However, taking antibiotics alone usually does not cure an abscess. Your caregiver may need to make a small cut (incision) in the abscess to drain the pus. In some cases, gauze is packed into the abscess to reduce pain and to continue draining the area. HOME CARE INSTRUCTIONS   Only take over-the-counter or prescription medicines for pain,  discomfort, or fever as directed by your caregiver.  If you were prescribed antibiotics, take them as directed. Finish them even if you start to feel better.  If gauze is used, follow your caregiver's directions for changing the gauze.  To avoid spreading the infection:  Keep your draining abscess covered with a bandage.  Wash your hands well.  Do not share personal care items, towels, or whirlpools with others.  Avoid skin contact with others.  Keep your skin and clothes clean around the abscess.  Keep all follow-up appointments as directed by your caregiver. SEEK MEDICAL CARE IF:   You have increased pain, swelling, redness, fluid drainage, or bleeding.  You have muscle aches, chills, or a general ill feeling.  You have a fever. MAKE SURE YOU:   Understand these instructions.  Will watch your condition.  Will get help right away if you are not doing well or get worse. Document Released: 09/24/2005 Document Revised: 06/15/2012 Document Reviewed: 02/27/2012 [facility] Patient Information 2015 [place], [place]. This information is not intended to replace advice given to you by your health care provider. Make sure you discuss any questions you have with your health care provider.

## 2015-06-07 NOTE — ED Provider Notes (Addendum)
CSN: [id number]     Arrival date & time 06/06/15  2326 History   First MD Initiated Contact with Patient 06/07/15 0057     Chief Complaint  Patient presents with  . Abscess     (Consider location/radiation/quality/duration/timing/severity/associated sxs/prior Treatment) HPI  This is a 39 year old male who presents with an abscess to the right hand. Patient is right-handed.  He reports that approximately 2 days ago he noted a pustule to the dorsum of the right hand. It is become increasingly painful, swollen, and irritated. He is also noted swelling of the fingers. Denies any weakness, numbness, tingling of the fevers. Denies any fevers at home. History of abscesses in the past. Denies drug abuse or injecting drugs.  Past Medical History  Diagnosis Date  . Back pain   . Hypertension   . Substance abuse    History reviewed. No pertinent past surgical history. History reviewed. No pertinent family history. History  Substance Use Topics  . Smoking status: Current Some Day Smoker -- 1.00 packs/day    Types: Cigarettes  . Smokeless tobacco: Not on file  . Alcohol Use: No    Review of Systems  Constitutional: Negative.  Negative for fever.  Respiratory: Negative.  Negative for chest tightness and shortness of breath.   Cardiovascular: Negative.  Negative for chest pain.  Gastrointestinal: Negative.   Genitourinary: Negative.  Negative for dysuria.  Musculoskeletal:       Hand pain  Skin: Positive for color change. Negative for rash.       Abscess  Neurological: Negative for weakness, numbness and headaches.  All other systems reviewed and are negative.     Allergies  Review of patient's allergies indicates no known allergies.  Home Medications   Prior to Admission medications   Medication Sig Start Date End Date Taking? Authorizing Provider  cephALEXin (KEFLEX) 500 MG capsule Take 1 capsule (500 mg total) by mouth 4 (four) times daily. 06/07/15   Shon Baton, MD   oxyCODONE-acetaminophen (PERCOCET/ROXICET) 5-325 MG per tablet Take 1 tablet by mouth every 6 (six) hours as needed for severe pain. 06/07/15   Shon Baton, MD  sulfamethoxazole-trimethoprim (BACTRIM DS,SEPTRA DS) 800-160 MG per tablet Take 1 tablet by mouth 2 (two) times daily. 06/07/15   Shon Baton, MD   BP 114/64 mmHg  Pulse 84  Temp(Src) 99.7 F (37.6 C) (Oral)  Resp 18  Ht  (1.778 m)  Wt 146 lb (66.225 kg)  BMI 20.95 kg/m2  SpO2 98% Physical Exam  Constitutional: He is oriented to person, place, and time. He appears well-developed and well-nourished. No distress.  HENT:  Head: Normocephalic and atraumatic.  Cardiovascular: Normal rate and regular rhythm.   Pulmonary/Chest: Effort normal. No respiratory distress.  Musculoskeletal: He exhibits edema.  Swelling noted over digits 2 through 5 of the right hand, flexor and extension mechanisms intact  Neurological: He is alert and oriented to person, place, and time.  Skin: Skin is warm and dry.  1 x 1 cm abscess noted just distal to the dorsum of her right wrist, no active drainage, swelling noted distally but no significant erythema  Psychiatric: He has a normal mood and affect.  Nursing note and vitals reviewed.   ED Course  INCISION AND DRAINAGE Date/Time: 06/07/2015 3:59 AM Performed by: Shon Baton Authorized by: Shon Baton Consent: Verbal consent obtained. Risks and benefits: risks, benefits and alternatives were discussed Consent given by: patient Type: abscess Body area: upper extremity Location details:  right wrist Anesthesia: local infiltration Local anesthetic: lidocaine 1% with epinephrine Patient sedated: no Scalpel size: 11 Incision type: single straight Complexity: simple Drainage: purulent Drainage amount: moderate Wound treatment: drain placed Packing material: 1/4 in iodoform gauze Patient tolerance: Patient tolerated the procedure well with no immediate complications    (including critical care time) Labs Review Labs Reviewed - No data to display  Imaging Review No results found.   EKG Interpretation None      MDM   Final diagnoses:  Abscess   Patient presents with an abscess to right hand. Swelling noted to the digits with good neurovascular exam. No significant erythema.  No signs of tenosynovitis. Abscess was drained at the bedside without difficulty. Given swelling, will also cover for cellulitis with Bactrim and Keflex. Given that the patient is right-handed and that hand infections can progress quickly, discussed with patient close monitoring and return for any new or worsening redness, swelling, pain, fevers or any other new or worsening symptoms. Patient stated understanding. He was given follow-up with the hand surgeon if his symptoms worsen.  After history, exam, and medical workup I feel the patient has been appropriately medically screened and is safe for discharge home. Pertinent diagnoses were discussed with the patient. Patient was given return precautions.    Shon Baton, MD 06/07/15 9528  Shon Baton, MD 06/07/15 4132

## 2015-06-07 NOTE — ED Notes (Signed)
MD at bedside. 

## 2017-11-22 ENCOUNTER — Other Ambulatory Visit: Payer: Self-pay

## 2017-11-22 ENCOUNTER — Encounter (HOSPITAL_BASED_OUTPATIENT_CLINIC_OR_DEPARTMENT_OTHER): Payer: Self-pay | Admitting: Emergency Medicine

## 2017-11-22 ENCOUNTER — Emergency Department (HOSPITAL_BASED_OUTPATIENT_CLINIC_OR_DEPARTMENT_OTHER): Payer: BLUE CROSS/BLUE SHIELD

## 2017-11-22 ENCOUNTER — Emergency Department (HOSPITAL_BASED_OUTPATIENT_CLINIC_OR_DEPARTMENT_OTHER)
Admission: EM | Admit: 2017-11-22 | Discharge: 2017-11-22 | Disposition: A | Discharge status: Home / Self Care | Payer: BLUE CROSS/BLUE SHIELD | Attending: Emergency Medicine | Admitting: Emergency Medicine

## 2017-11-22 DIAGNOSIS — S93402A Sprain of unspecified ligament of left ankle, initial encounter: Secondary | ICD-10-CM | POA: Diagnosis not present

## 2017-11-22 DIAGNOSIS — Y939 Activity, unspecified: Secondary | ICD-10-CM | POA: Diagnosis not present

## 2017-11-22 DIAGNOSIS — S99812A Other specified injuries of left ankle, initial encounter: Secondary | ICD-10-CM | POA: Diagnosis present

## 2017-11-22 DIAGNOSIS — F17210 Nicotine dependence, cigarettes, uncomplicated: Secondary | ICD-10-CM | POA: Diagnosis not present

## 2017-11-22 DIAGNOSIS — Y999 Unspecified external cause status: Secondary | ICD-10-CM | POA: Diagnosis not present

## 2017-11-22 DIAGNOSIS — W109XXA Fall (on) (from) unspecified stairs and steps, initial encounter: Secondary | ICD-10-CM | POA: Diagnosis not present

## 2017-11-22 DIAGNOSIS — Y929 Unspecified place or not applicable: Secondary | ICD-10-CM | POA: Insufficient documentation

## 2017-11-22 DIAGNOSIS — I10 Essential (primary) hypertension: Secondary | ICD-10-CM | POA: Diagnosis not present

## 2017-11-22 MED ORDER — IBUPROFEN 800 MG PO TABS: 800.0000 mg | ORAL_TABLET | Freq: Three times a day (TID) | ORAL | 0 refills | Status: AC | PRN

## 2017-11-22 NOTE — Discharge Instructions (Signed)
As we discussed, you do not have any broken or dislocated bones in your foot or ankle, but you do have an ankle sprain. There is always a chance that a small fracture did not appear on today's x-ray. Please read through the included information about routine injury care (RICE = rest, ice, compression, elevation), and take over-the-counter pain medicine according to label instructions.  If you do not have any reason to avoid ibuprofen, you can also consider taking ibuprofen 600 mg 3 times a day with meals, but do this for no more than 5 days as it may cause to some stomach discomfort over time.  Use crutches if provided and you may bear weight as tolerated.  Follow-up is recommended with the orthopedic surgeon or with your regular doctor. ° ° °Ankle Sprain °An ankle sprain is an injury to the strong, fibrous tissues (ligaments) that hold the bones of your ankle joint together.  °CAUSES °An ankle sprain is usually caused by a fall or by twisting your ankle. Ankle sprains most commonly occur when you step on the outer edge of your foot, and your ankle turns inward. People who participate in sports are more prone to these types of injuries.  °SYMPTOMS  °Pain in your ankle. The pain may be present at rest or only when you are trying to stand or walk. °Swelling. °Bruising. Bruising may develop immediately or within 1 to 2 days after your injury. °Difficulty standing or walking, particularly when turning corners or changing directions. °DIAGNOSIS  °Your caregiver will ask you details about your injury and perform a physical exam of your ankle to determine if you have an ankle sprain. During the physical exam, your caregiver will press on and apply pressure to specific areas of your foot and ankle. Your caregiver will try to move your ankle in certain ways. An X-ray exam may be done to be sure a bone was not broken or a ligament did not separate from one of the bones in your ankle (avulsion fracture).  °TREATMENT  °Certain  types of braces can help stabilize your ankle. Your caregiver can make a recommendation for this. Your caregiver may recommend the use of medicine for pain. If your sprain is severe, your caregiver may refer you to a surgeon who helps to restore function to parts of your skeletal system (orthopedist) or a physical therapist. °HOME CARE INSTRUCTIONS  °Apply ice to your injury for 1-2 days or as directed by your caregiver. Applying ice helps to reduce inflammation and pain. °Put ice in a plastic bag. °Place a towel between your skin and the bag. °Leave the ice on for 15-20 minutes at a time, every 2 hours while you are awake. °Only take over-the-counter or prescription medicines for pain, discomfort, or fever as directed by your caregiver. °Elevate your injured ankle above the level of your heart as much as possible for 2-3 days. °If your caregiver recommends crutches, use them as instructed. Gradually put weight on the affected ankle. Continue to use crutches or a cane until you can walk without feeling pain in your ankle. °If you have a plaster splint, wear the splint as directed by your caregiver. Do not rest it on anything harder than a pillow for the first 24 hours. Do not put weight on it. Do not get it wet. You may take it off to take a shower or bath. °You may have been given an elastic bandage to wear around your ankle to provide support. If the elastic bandage   is too tight (you have numbness or tingling in your foot or your foot becomes cold and blue), adjust the bandage to make it comfortable. °If you have an air splint, you may blow more air into it or let air out to make it more comfortable. You may take your splint off at night and before taking a shower or bath. Wiggle your toes in the splint several times per day to decrease swelling. °SEEK MEDICAL CARE IF:  °You have rapidly increasing bruising or swelling. °Your toes feel extremely cold or you lose feeling in your foot. °Your pain is not relieved  with medicine. °SEEK IMMEDIATE MEDICAL CARE IF: °Your toes are numb or blue. °You have severe pain that is increasing. °MAKE SURE YOU:  °Understand these instructions. °Will watch your condition. °Will get help right away if you are not doing well or get worse. °  °This information is not intended to replace advice given to you by your health care provider. Make sure you discuss any questions you have with your health care provider. °  °Document Released: 12/15/2005 Document Revised: 01/05/2015 Document Reviewed: 12/27/2011 °Elsevier Interactive Patient Education ©2016 [employer] ° °Elastic Bandage and RICE °WHAT DOES AN ELASTIC BANDAGE DO? °Elastic bandages come in different shapes and sizes. They generally provide support to your injury and reduce swelling while you are healing, but they can perform different functions. Your health care provider will help you to decide what is best for your protection, recovery, or rehabilitation following an injury. °WHAT ARE SOME GENERAL TIPS FOR USING AN ELASTIC BANDAGE? °Use the bandage as directed by the maker of the bandage that you are using. °Do not wrap the bandage too tightly. This may cut off the circulation in the arm or leg in the area below the bandage. °If part of your body beyond the bandage becomes blue, numb, cold, swollen, or is more painful, your bandage is most likely too tight. If this occurs, remove your bandage and reapply it more loosely. °See your health care provider if the bandage seems to be making your problems worse rather than better. °An elastic bandage should be removed and reapplied every 3-4 hours or as directed by your health care provider. °WHAT IS RICE? °The routine care of many injuries includes rest, ice, compression, and elevation (RICE therapy).  °Rest °Rest is required to allow your body to heal. Generally, you can resume your routine activities when you are comfortable and have been given permission by your health care  provider. °Ice °Icing your injury helps to keep the swelling down and it reduces pain. Do not apply ice directly to your skin. °Put ice in a plastic bag. °Place a towel between your skin and the bag. °Leave the ice on for 20 minutes, 2-3 times per day. °Do this for as Jessi Pitstick as you are directed by your health care provider. °Compression °Compression helps to keep swelling down, gives support, and helps with discomfort. Compression may be done with an elastic bandage. °Elevation °Elevation helps to reduce swelling and it decreases pain. If possible, your injured area should be placed at or above the level of your heart or the center of your chest. °WHEN SHOULD I SEEK MEDICAL CARE? °You should seek medical care if: °You have persistent pain and swelling. °Your symptoms are getting worse rather than improving. °These symptoms may indicate that further evaluation or further X-rays are needed. Sometimes, X-rays may not show a small broken bone (fracture) until a number of days later. Make   a follow-up appointment with your health care provider. Ask when your X-ray results will be ready. Make sure that you get your X-ray results. °WHEN SHOULD I SEEK IMMEDIATE MEDICAL CARE? °You should seek immediate medical care if: °You have a sudden onset of severe pain at or below the area of your injury. °You develop redness or increased swelling around your injury. °You have tingling or numbness at or below the area of your injury that does not improve after you remove the elastic bandage. °  °This information is not intended to replace advice given to you by your health care provider. Make sure you discuss any questions you have with your health care provider. °  °Document Released: 06/06/2002 Document Revised: 09/05/2015 Document Reviewed: 07/31/2014 °Elsevier Interactive Patient Education ©2016 [employer] ° °  °

## 2017-11-22 NOTE — ED Triage Notes (Signed)
L ankle pain after twisting it on the stairs today.

## 2017-11-22 NOTE — ED Provider Notes (Signed)
Emergency Department Provider Note   I have reviewed the triage vital signs and the nursing notes.   HISTORY  Chief Complaint Ankle Pain   HPI Chad Steele is a 41 y.o. male with PMH of HTN presents to the emergency department for evaluation.  Patient fell down the stairs today twisting the left ankle.  He denies any knee or hip pain.  No fall resulting in head trauma or loss of consciousness.  Reports severe left ankle pain and swelling after the injury.  No numbness or tingling in the foot.    Past Medical History:  Diagnosis Date  . Back pain   . Hypertension   . Substance abuse (HCC)     There are no active problems to display for this patient.   History reviewed. No pertinent surgical history.  Current Outpatient Rx  . Order #: [id number] Class: Print  . Order #: [id number] Class: Print  . Order #: [id number] Class: Print  . Order #: [id number] Class: Print    Allergies Patient has no known allergies.  No family history on file.  Social History Social History   Tobacco Use  . Smoking status: Current Every Day Smoker    Packs/day: 1.00    Types: Cigarettes  . Smokeless tobacco: Never Used  Substance Use Topics  . Alcohol use: No  . Drug use: No    Review of Systems  Constitutional: No fever/chills Eyes: No visual changes. ENT: No sore throat. Cardiovascular: Denies chest pain. Respiratory: Denies shortness of breath. Gastrointestinal: No abdominal pain.  No nausea, no vomiting.  No diarrhea.  No constipation. Genitourinary: Negative for dysuria. Musculoskeletal: Negative for back pain. Positive left ankle pain and swelling.  Skin: Negative for rash. Neurological: Negative for headaches, focal weakness or numbness.  10-point ROS otherwise negative.  ____________________________________________   PHYSICAL EXAM:  VITAL SIGNS: ED Triage Vitals  Enc Vitals Group     BP 11/22/17 1547 (!) 159/79     Pulse Rate 11/22/17 1547 95     Resp 11/22/17 1547  18     Temp 11/22/17 1547 98.2 F (36.8 C)     Temp Source 11/22/17 1547 Oral     SpO2 11/22/17 1547 98 %     Weight 11/22/17 1548 158 lb (71.7 kg)     Height 11/22/17 1548 5\' 10"  (1.778 m)     Pain Score 11/22/17 1547 10   Constitutional: Alert and oriented. Well appearing and in no acute distress. Eyes: Conjunctivae are normal. Head: Atraumatic. Nose: No congestion/rhinnorhea. Mouth/Throat: Mucous membranes are moist. Neck: No stridor.   Cardiovascular: Good peripheral circulation. Respiratory: Normal respiratory effort.   Gastrointestinal: No distention.  Musculoskeletal: Left ankle pain and diffuse swelling. No laceration or abrasion. No proximal fibular tenderness.  Neurologic:  Normal speech and language. No gross focal neurologic deficits are appreciated.  Skin:  Skin is warm, dry and intact. No rash noted.  ____________________________________________  RADIOLOGY  Dg Ankle Complete Left  Result Date: 11/22/2017 CLINICAL DATA:  Left ankle pain after twisting injury today. EXAM: LEFT ANKLE COMPLETE - 3+ VIEW COMPARISON:  None. FINDINGS: Moderate soft tissue swelling over the medial malleolus with mild soft tissue swelling over the lateral. Tibiotalar and subtalar joints are maintained without fracture or joint dislocations. The base of fifth metatarsal appears intact. IMPRESSION: Periarticular soft tissue swelling is noted, more so medially about the left ankle. No acute fracture nor joint dislocation. Electronically Signed   By: Tollie Ethavid  Kwon M.D.   On: 11/22/2017 16:15  ____________________________________________   PROCEDURES  Procedure(s) performed:   Procedures  None ____________________________________________   INITIAL IMPRESSION / ASSESSMENT AND PLAN / ED COURSE  Pertinent labs & imaging results that were available during my care of the patient were reviewed by me and considered in my medical decision making (see chart for details).  Patient presents  emergency department after rolling the left ankle while walking down the steps.  He has ankle swelling.  No laceration or abrasion.  No proximal fibular tenderness.  X-ray of the ankle shows no acute fracture or dislocation.  Plan for Aircast, crutches, RICE, and will refer to outpatient sports medicine.   At this time, I do not feel there is any life-threatening condition present. I have reviewed and discussed all results (EKG, imaging, lab, urine as appropriate), exam findings with patient. I have reviewed nursing notes and appropriate previous records.  I feel the patient is safe to be discharged home without further emergent workup. Discussed usual and customary return precautions. Patient and family (if present) verbalize understanding and are comfortable with this plan.  Patient will follow-up with their primary care provider. If they do not have a primary care provider, information for follow-up has been provided to them. All questions have been answered.  ____________________________________________  FINAL CLINICAL IMPRESSION(S) / ED DIAGNOSES  Final diagnoses:  Sprain of left ankle, unspecified ligament, initial encounter    Note:  This document was prepared using Dragon voice recognition software and may include unintentional dictation errors.  Alona BeneJoshua Joycie Aerts, MD Emergency Medicine    Marcio Hoque, Arlyss RepressJoshua G, MD 11/23/17 [phone]

## 2018-02-18 ENCOUNTER — Encounter (HOSPITAL_BASED_OUTPATIENT_CLINIC_OR_DEPARTMENT_OTHER): Payer: Self-pay | Admitting: *Deleted

## 2018-02-18 ENCOUNTER — Other Ambulatory Visit: Payer: Self-pay

## 2018-02-18 ENCOUNTER — Emergency Department (HOSPITAL_BASED_OUTPATIENT_CLINIC_OR_DEPARTMENT_OTHER): Payer: BLUE CROSS/BLUE SHIELD

## 2018-02-18 ENCOUNTER — Emergency Department (HOSPITAL_BASED_OUTPATIENT_CLINIC_OR_DEPARTMENT_OTHER)
Admission: EM | Admit: 2018-02-18 | Discharge: 2018-02-18 | Disposition: A | Discharge status: Home / Self Care | Payer: BLUE CROSS/BLUE SHIELD | Attending: Emergency Medicine | Admitting: Emergency Medicine

## 2018-02-18 DIAGNOSIS — I10 Essential (primary) hypertension: Secondary | ICD-10-CM | POA: Diagnosis not present

## 2018-02-18 DIAGNOSIS — M25511 Pain in right shoulder: Secondary | ICD-10-CM | POA: Insufficient documentation

## 2018-02-18 DIAGNOSIS — W228XXA Striking against or struck by other objects, initial encounter: Secondary | ICD-10-CM | POA: Insufficient documentation

## 2018-02-18 DIAGNOSIS — Y9289 Other specified places as the place of occurrence of the external cause: Secondary | ICD-10-CM | POA: Insufficient documentation

## 2018-02-18 DIAGNOSIS — Y990 Civilian activity done for income or pay: Secondary | ICD-10-CM | POA: Insufficient documentation

## 2018-02-18 DIAGNOSIS — F17210 Nicotine dependence, cigarettes, uncomplicated: Secondary | ICD-10-CM | POA: Insufficient documentation

## 2018-02-18 DIAGNOSIS — Y9384 Activity, sleeping: Secondary | ICD-10-CM | POA: Diagnosis not present

## 2018-02-18 NOTE — ED Notes (Signed)
ED Provider at bedside. 

## 2018-02-18 NOTE — Discharge Instructions (Signed)
X-ray is reassuring.  No broken bones.  Please continue to take ibuprofen every 6 hours as needed for pain.  Apply ice to the shoulder twice a day for 15 minutes at a time.  I have written you a work note.   If your symptoms are not improving in a week please schedule an appointment with sports medicine Dr. Pearletha ForgeHudnall.  His information is listed below.

## 2018-02-18 NOTE — ED Triage Notes (Signed)
States he fell asleep while standing yesterday and a bumped into a ladder causing it to fall on him. Injury to his right shoulder.

## 2018-02-18 NOTE — ED Provider Notes (Signed)
[facility] EMERGENCY DEPARTMENT Provider Note   CSN: [id number] Arrival date & time: 02/18/18  1104     History   Chief Complaint Chief Complaint  Patient presents with  . Fall  . Shoulder Injury    HPI Chad Steele is a 42 y.o. male.  HPI   Chad Steele is a 42 year old male with a history of hypertension who presents to the emergency department for evaluation of right shoulder pain.  Patient states that he has insomnia and works night shift. Early yesterday morning, after one of his shifts he was tired and sitting on two steps next to a ladder. He fell asleep while sitting and fell forward, hitting a the ladder which fell on his right shoulder.  Denies hitting his head.  States that he now has posterior right shoulder pain which is sharp and present only with movement of the right arm (particularly abduction and flexion of the shoulder joint.)  Pain is 8/10 in severity at its worst.  He denies pain in the absence of movement.  Tried taking ibuprofen and placing heat over the shoulder with some relief.  Denies previous shoulder surgery or injury.  Denies headache, numbness, weakness, open wound, arthralgias elsewhere.  He works at a [employer] and lifts heavy items, concerned that he will not be able to work given his injury.  Past Medical History:  Diagnosis Date  . Back pain   . Hypertension   . Substance abuse (HCC)     There are no active problems to display for this patient.   History reviewed. No pertinent surgical history.     Home Medications    Prior to Admission medications   Medication Sig Start Date End Date Taking? Authorizing Provider  cephALEXin (KEFLEX) 500 MG capsule Take 1 capsule (500 mg total) by mouth 4 (four) times daily. 06/07/15   Horton, Mayer Maskerourtney F, MD  ibuprofen (ADVIL,MOTRIN) 800 MG tablet Take 1 tablet (800 mg total) by mouth every 8 (eight) hours as needed. 11/22/17   Long, Arlyss RepressJoshua G, MD  oxyCODONE-acetaminophen (PERCOCET/ROXICET) 5-325 MG  per tablet Take 1 tablet by mouth every 6 (six) hours as needed for severe pain. 06/07/15   Horton, Mayer Maskerourtney F, MD  sulfamethoxazole-trimethoprim (BACTRIM DS,SEPTRA DS) 800-160 MG per tablet Take 1 tablet by mouth 2 (two) times daily. 06/07/15   Horton, Mayer Maskerourtney F, MD    Family History No family history on file.  Social History Social History   Tobacco Use  . Smoking status: Current Every Day Smoker    Packs/day: 1.00    Types: Cigarettes  . Smokeless tobacco: Never Used  Substance Use Topics  . Alcohol use: No  . Drug use: No     Allergies   Patient has no known allergies.   Review of Systems Review of Systems  Constitutional: Negative for chills and fever.  Musculoskeletal: Positive for arthralgias (right shoulder).  Skin: Negative for wound.  Neurological: Negative for weakness and numbness.     Physical Exam Updated Vital Signs BP 118/72   Pulse 75   Temp 98.5 F (36.9 C) (Oral)   Resp 16   Ht 5\' 10"  (1.778 m)   Wt 72.6 kg (160 lb)   SpO2 99%   BMI 22.96 kg/m   Physical Exam  Constitutional: He is oriented to person, place, and time. He appears well-developed and well-nourished. No distress.  HENT:  Head: Normocephalic and atraumatic.  Eyes: Right eye exhibits no discharge. Left eye exhibits no discharge.  Pulmonary/Chest: Effort  normal. No respiratory distress.  Musculoskeletal:       Arms: Right shoulder with tenderness to palpation as depicted in image.  Full active range of motion of the shoulder joint, although painful.  Negative empty can test, negative Neer's, negative lift off. No swelling, erythema or ecchymosis present. No step-off, crepitus, or deformity appreciated. 5/5 muscle strength of UE. 2+ radial pulse, sensation intact and all compartments soft.  Neurological: He is alert and oriented to person, place, and time. Coordination normal.  Skin: Skin is warm and dry. Capillary refill takes less than 2 seconds. He is not diaphoretic.  Psychiatric:  He has a normal mood and affect. His behavior is normal.  Nursing note and vitals reviewed.    ED Treatments / Results  Labs (all labs ordered are listed, but only abnormal results are displayed) Labs Reviewed - No data to display  EKG  EKG Interpretation None       Radiology Dg Shoulder Right  Result Date: 02/18/2018 CLINICAL DATA:  The patient fell asleep while standing yesterday resulting in a fall and right shoulder injury. Question fracture. EXAM: RIGHT SHOULDER - 2+ VIEW COMPARISON:  None. FINDINGS: There is no evidence of fracture or dislocation. There is no evidence of arthropathy or other focal bone abnormality. Soft tissues are unremarkable. Clothing artifact is best seen on the axillary view. IMPRESSION: Negative. Electronically Signed   By: Drusilla Kanner M.D.   On: 02/18/2018 11:32    Procedures Procedures (including critical care time)  Medications Ordered in ED Medications - No data to display   Initial Impression / Assessment and Plan / ED Course  I have reviewed the triage vital signs and the nursing notes.  Pertinent labs & imaging results that were available during my care of the patient were reviewed by me and considered in my medical decision making (see chart for details).    X-ray of right shoulder without acute abnormality.  On exam, rotator cuff intact and right upper extremity is neurovascularly intact.  No open wound or swelling. No signs of infection. Discussed RICE protocol and NSAIDs for pain management. Have given patient information to follow up with sports medicine Dr. Pearletha Forge if symptoms are not improving in a week.  Patient agrees to the above plan.  Final Clinical Impressions(s) / ED Diagnoses   Final diagnoses:  Acute pain of right shoulder    ED Discharge Orders    None       Lawrence Marseilles 02/18/18 1148    Alvira Monday, MD 02/18/18 2326

## 2018-02-18 NOTE — ED Notes (Signed)
Patient transported to X-ray 

## 2018-04-13 ENCOUNTER — Emergency Department (HOSPITAL_BASED_OUTPATIENT_CLINIC_OR_DEPARTMENT_OTHER)
Admission: EM | Admit: 2018-04-13 | Discharge: 2018-04-13 | Disposition: A | Discharge status: Home / Self Care | Payer: BLUE CROSS/BLUE SHIELD | Attending: Emergency Medicine | Admitting: Emergency Medicine

## 2018-04-13 ENCOUNTER — Other Ambulatory Visit: Payer: Self-pay

## 2018-04-13 ENCOUNTER — Emergency Department (HOSPITAL_BASED_OUTPATIENT_CLINIC_OR_DEPARTMENT_OTHER): Payer: BLUE CROSS/BLUE SHIELD

## 2018-04-13 ENCOUNTER — Encounter (HOSPITAL_BASED_OUTPATIENT_CLINIC_OR_DEPARTMENT_OTHER): Payer: Self-pay | Admitting: *Deleted

## 2018-04-13 DIAGNOSIS — W228XXA Striking against or struck by other objects, initial encounter: Secondary | ICD-10-CM | POA: Insufficient documentation

## 2018-04-13 DIAGNOSIS — S20211A Contusion of right front wall of thorax, initial encounter: Secondary | ICD-10-CM | POA: Diagnosis not present

## 2018-04-13 DIAGNOSIS — Y999 Unspecified external cause status: Secondary | ICD-10-CM | POA: Diagnosis not present

## 2018-04-13 DIAGNOSIS — Y9384 Activity, sleeping: Secondary | ICD-10-CM | POA: Insufficient documentation

## 2018-04-13 DIAGNOSIS — Y92015 Private garage of single-family (private) house as the place of occurrence of the external cause: Secondary | ICD-10-CM | POA: Diagnosis not present

## 2018-04-13 DIAGNOSIS — F17210 Nicotine dependence, cigarettes, uncomplicated: Secondary | ICD-10-CM | POA: Insufficient documentation

## 2018-04-13 DIAGNOSIS — I10 Essential (primary) hypertension: Secondary | ICD-10-CM | POA: Diagnosis not present

## 2018-04-13 DIAGNOSIS — S299XXA Unspecified injury of thorax, initial encounter: Secondary | ICD-10-CM | POA: Diagnosis present

## 2018-04-13 HISTORY — DX: Anxiety disorder, unspecified: F41.9

## 2018-04-13 HISTORY — DX: Other chronic pain: G89.29

## 2018-04-13 MED ORDER — TRAMADOL HCL 50 MG PO TABS
50.0000 mg | ORAL_TABLET | Freq: Once | ORAL | Status: AC
  Administered 2018-04-13: 50 mg via ORAL
  Filled 2018-04-13: qty 1

## 2018-04-13 MED ORDER — TRAMADOL HCL 50 MG PO TABS: 50.0000 mg | ORAL_TABLET | Freq: Four times a day (QID) | ORAL | 0 refills | Status: AC | PRN

## 2018-04-13 NOTE — ED Notes (Signed)
Patient transported to X-ray 

## 2018-04-13 NOTE — ED Notes (Signed)
ED Provider at bedside. 

## 2018-04-13 NOTE — ED Provider Notes (Signed)
[facility] EMERGENCY DEPARTMENT Provider Note   CSN: [id number] Arrival date & time: 04/13/18  1148     History   Chief Complaint Chief Complaint  Patient presents with  . Rib Injury    HPI Chad Steele is a 42 y.o. male.  42 year old male.  States that he was sitting on a curb in his garage.  He had worked third shift.  He states he was falling asleep.  He was waiting for his wife.  He was drowsy and fell forward.  He struck his right anterolateral chest wall against the edge of a door that was laying on the ground next to him.  This was a few days ago.  He is attempted to work and continue doing ADLs but he is becoming more more painful.  He has go back to work.  He works at do his bakery "on the line".  Not a lot of heavy lifting but repetitive motion and bending and twisting.  Crepitus.  No cough or hemoptysis.  No frank shortness of breath.  No visible bruising or ecchymosis.  No abdominal symptoms.  HPI  Past Medical History:  Diagnosis Date  . Anxiety   . Back pain   . Chronic pain   . Hypertension   . Substance abuse (HCC)     There are no active problems to display for this patient.   History reviewed. No pertinent surgical history.      Home Medications    Prior to Admission medications   Medication Sig Start Date End Date Taking? Authorizing Provider  ibuprofen (ADVIL,MOTRIN) 800 MG tablet Take 1 tablet (800 mg total) by mouth every 8 (eight) hours as needed. 11/22/17   Long, Arlyss RepressJoshua G, MD  traMADol (ULTRAM) 50 MG tablet Take 1 tablet (50 mg total) by mouth every 6 (six) hours as needed. 04/13/18   Rolland PorterJames, Leshawn Houseworth, MD    Family History History reviewed. No pertinent family history.  Social History Social History   Tobacco Use  . Smoking status: Current Every Day Smoker    Packs/day: 0.50    Types: Cigarettes  . Smokeless tobacco: Never Used  Substance Use Topics  . Alcohol use: No  . Drug use: Yes    Types: IV     Allergies   Patient  has no known allergies.   Review of Systems Review of Systems  Constitutional: Negative for appetite change, chills, diaphoresis, fatigue and fever.  HENT: Negative for mouth sores, sore throat and trouble swallowing.   Eyes: Negative for visual disturbance.  Respiratory: Negative for cough, chest tightness, shortness of breath and wheezing.   Cardiovascular: Positive for chest pain.  Gastrointestinal: Negative for abdominal distention, abdominal pain, diarrhea, nausea and vomiting.  Endocrine: Negative for polydipsia, polyphagia and polyuria.  Genitourinary: Negative for dysuria, frequency and hematuria.  Musculoskeletal: Negative for gait problem.  Skin: Negative for color change, pallor and rash.  Neurological: Negative for dizziness, syncope, light-headedness and headaches.  Hematological: Does not bruise/bleed easily.  Psychiatric/Behavioral: Negative for behavioral problems and confusion.     Physical Exam Updated Vital Signs BP 126/76   Pulse 87   Temp 98.4 F (36.9 C) (Oral)   Resp 16   Ht 5\' 10"  (1.778 m)   Wt 74.8 kg (165 lb)   SpO2 98%   BMI 23.68 kg/m   Physical Exam  Constitutional: He is oriented to person, place, and time. He appears well-developed and well-nourished. No distress.  HENT:  Head: Normocephalic.  Eyes: Pupils  are equal, round, and reactive to light. Conjunctivae are normal. No scleral icterus.  Neck: Normal range of motion. Neck supple. No thyromegaly present.  Cardiovascular: Normal rate and regular rhythm. Exam reveals no gallop and no friction rub.  No murmur heard. Pulmonary/Chest: Effort normal and breath sounds normal. No respiratory distress. He has no wheezes. He has no rales.  There is a right anterolateral inferior chest wall.  No visible bruising.  No subcutaneous air.  No bony crepitus.  Nontender over the upper abdomen under the costal margin.  No flank pain.  Abdominal: Soft. Bowel sounds are normal. He exhibits no distension.  There is no tenderness. There is no rebound.  Musculoskeletal: Normal range of motion.  Neurological: He is alert and oriented to person, place, and time.  Skin: Skin is warm and dry. No rash noted.  Psychiatric: He has a normal mood and affect. His behavior is normal.     ED Treatments / Results  Labs (all labs ordered are listed, but only abnormal results are displayed) Labs Reviewed - No data to display  EKG None  Radiology Dg Ribs Unilateral W/chest Right  Result Date: 04/13/2018 CLINICAL DATA:  Right rib pain after fall 4 days ago. EXAM: RIGHT RIBS AND CHEST - 3+ VIEW COMPARISON:  None. FINDINGS: No fracture or other bone lesions are seen involving the ribs. There is no evidence of pneumothorax or pleural effusion. Both lungs are clear. Heart size and mediastinal contours are within normal limits. IMPRESSION: Normal right ribs.  No acute cardiopulmonary abnormality seen. Electronically Signed   By: Lupita Raider, M.D.   On: 04/13/2018 12:20    Procedures Procedures (including critical care time)  Medications Ordered in ED Medications  traMADol (ULTRAM) tablet 50 mg (50 mg Oral Given 04/13/18 1401)     Initial Impression / Assessment and Plan / ED Course  I have reviewed the triage vital signs and the nursing notes.  Pertinent labs & imaging results that were available during my care of the patient were reviewed by me and considered in my medical decision making (see chart for details).    Tunnel exam.  X-rays show no fracture.  No contusion or pneumothorax.  Given Ultram.  Will use Ultram for pain not relieved with Motrin or Tylenol.  Work note.  Will return when he can work her tolerates movement without controlled substance.  Final Clinical Impressions(s) / ED Diagnoses   Final diagnoses:  Contusion of right chest wall, initial encounter    ED Discharge Orders        Ordered    traMADol (ULTRAM) 50 MG tablet  Every 6 hours PRN     04/13/18 1411       Rolland Porter, MD 04/13/18 1417

## 2018-04-13 NOTE — ED Triage Notes (Signed)
pt c/o fall with right rib injury x 3 days ago , no bruising or abrasion noted

## 2018-04-13 NOTE — Discharge Instructions (Addendum)
Cough, and deep breathe 10 times per hour. Tylenol and/or Motrin for mild pain. Ultram for pain not relieved with Tylenol Motrin.

## 2019-07-20 IMAGING — CR DG RIBS W/ CHEST 3+V*R*
3 series · 3 of 3 positions shown · non-contrast
Comparison: None.

CLINICAL DATA: Right rib pain after fall 4 days ago.

EXAM:
RIGHT RIBS AND CHEST - 3+ VIEW

[w chest pa]
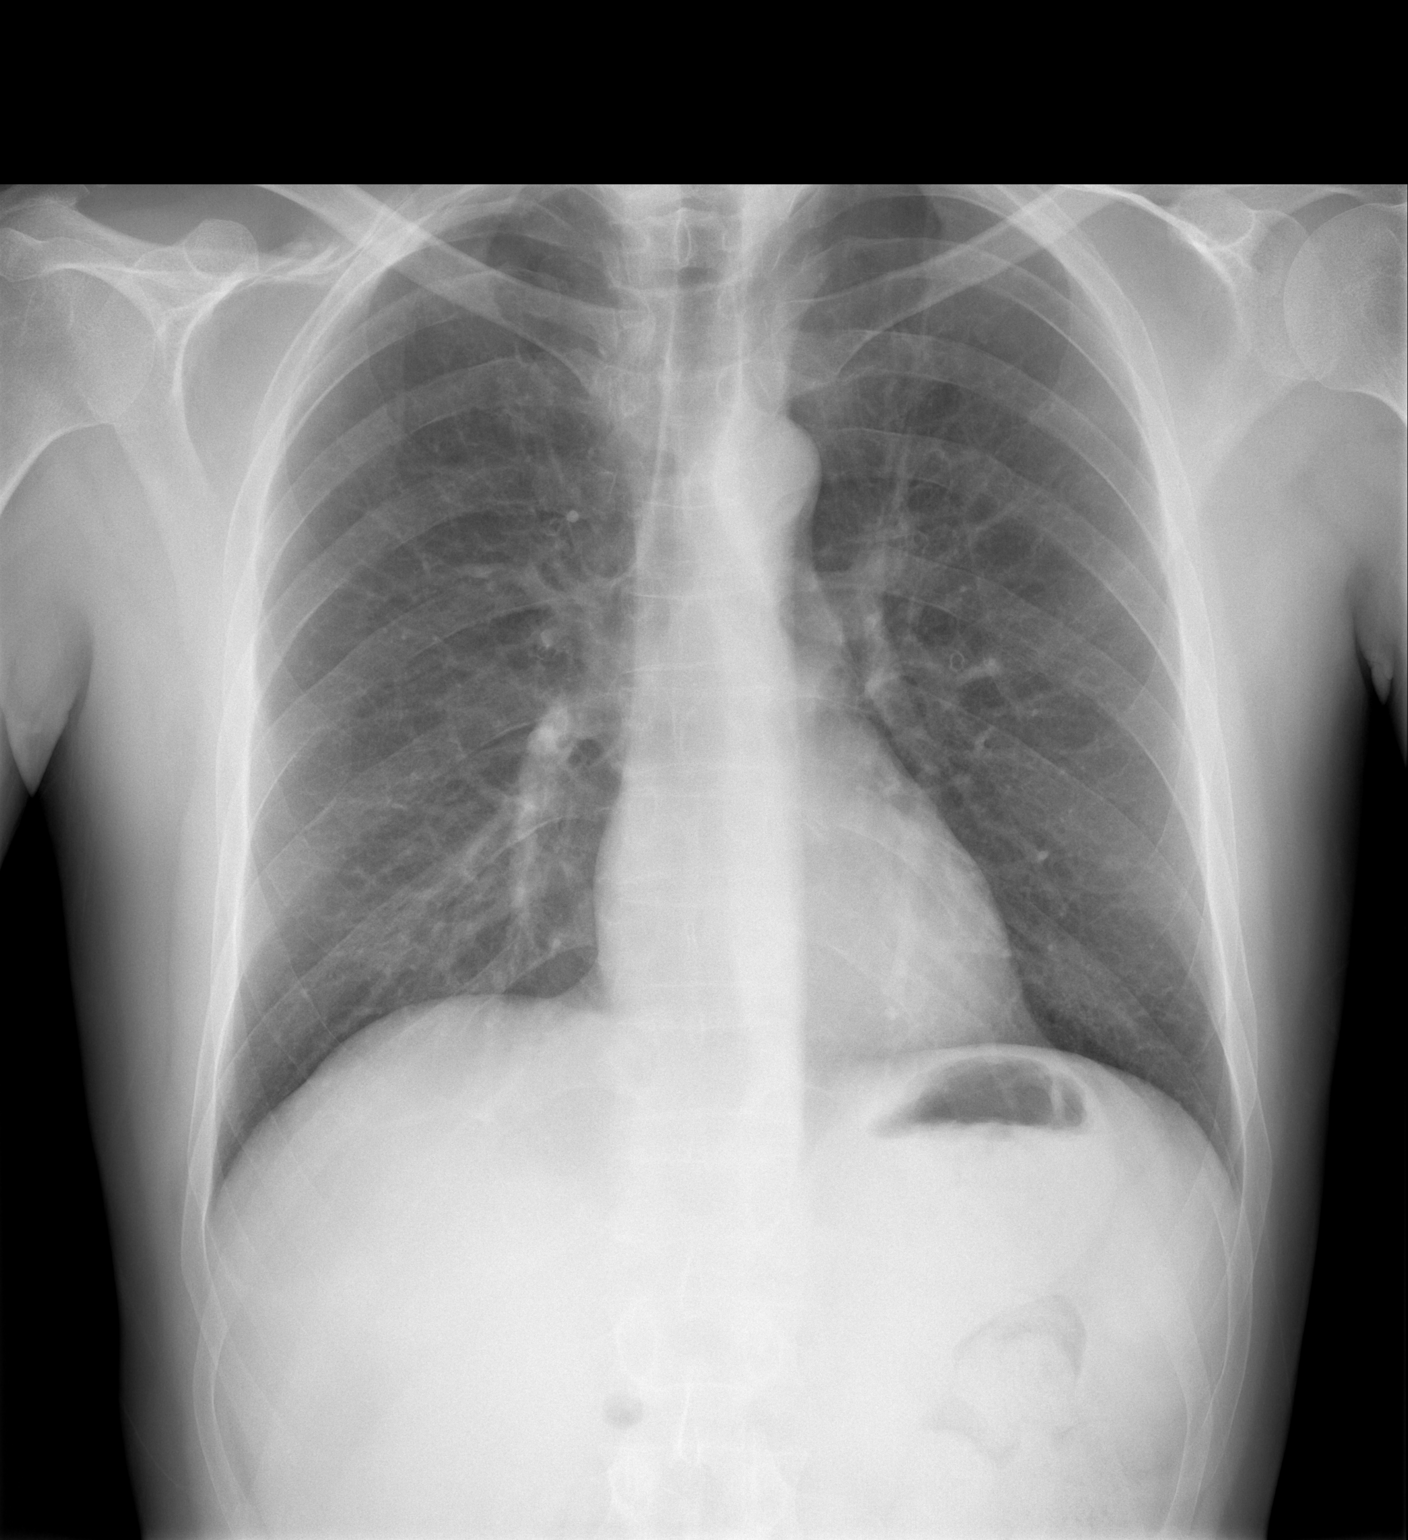

[w ribs ap/pa lower right *]
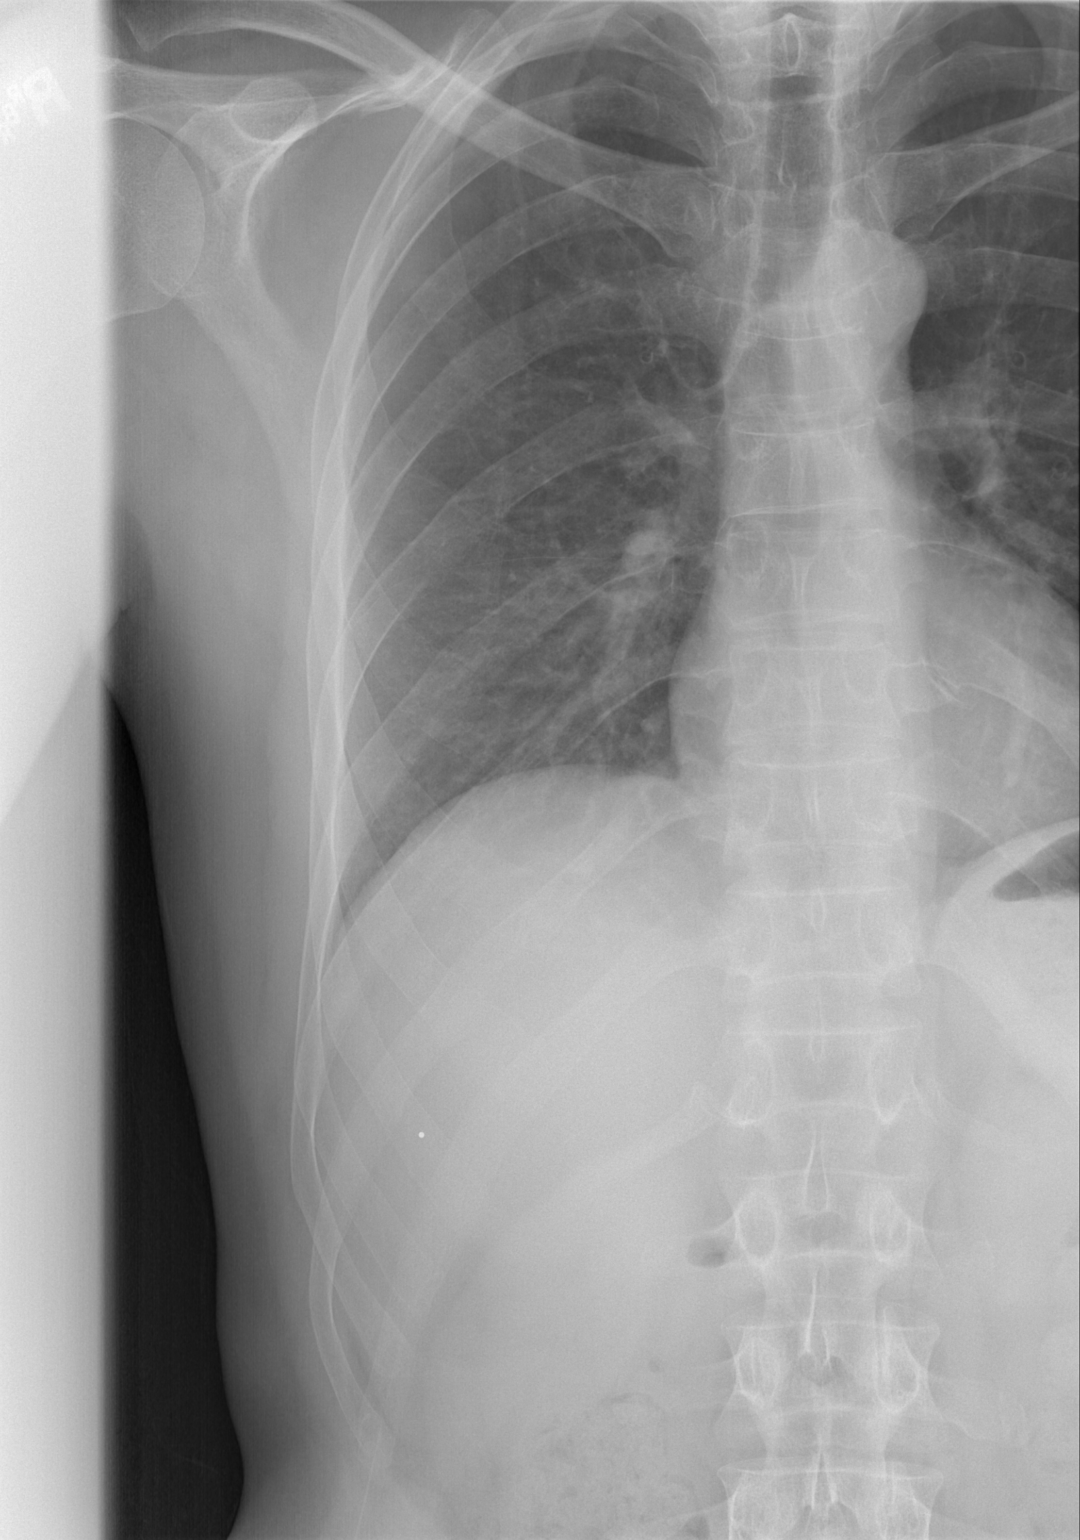

[w ribs oblique right *]
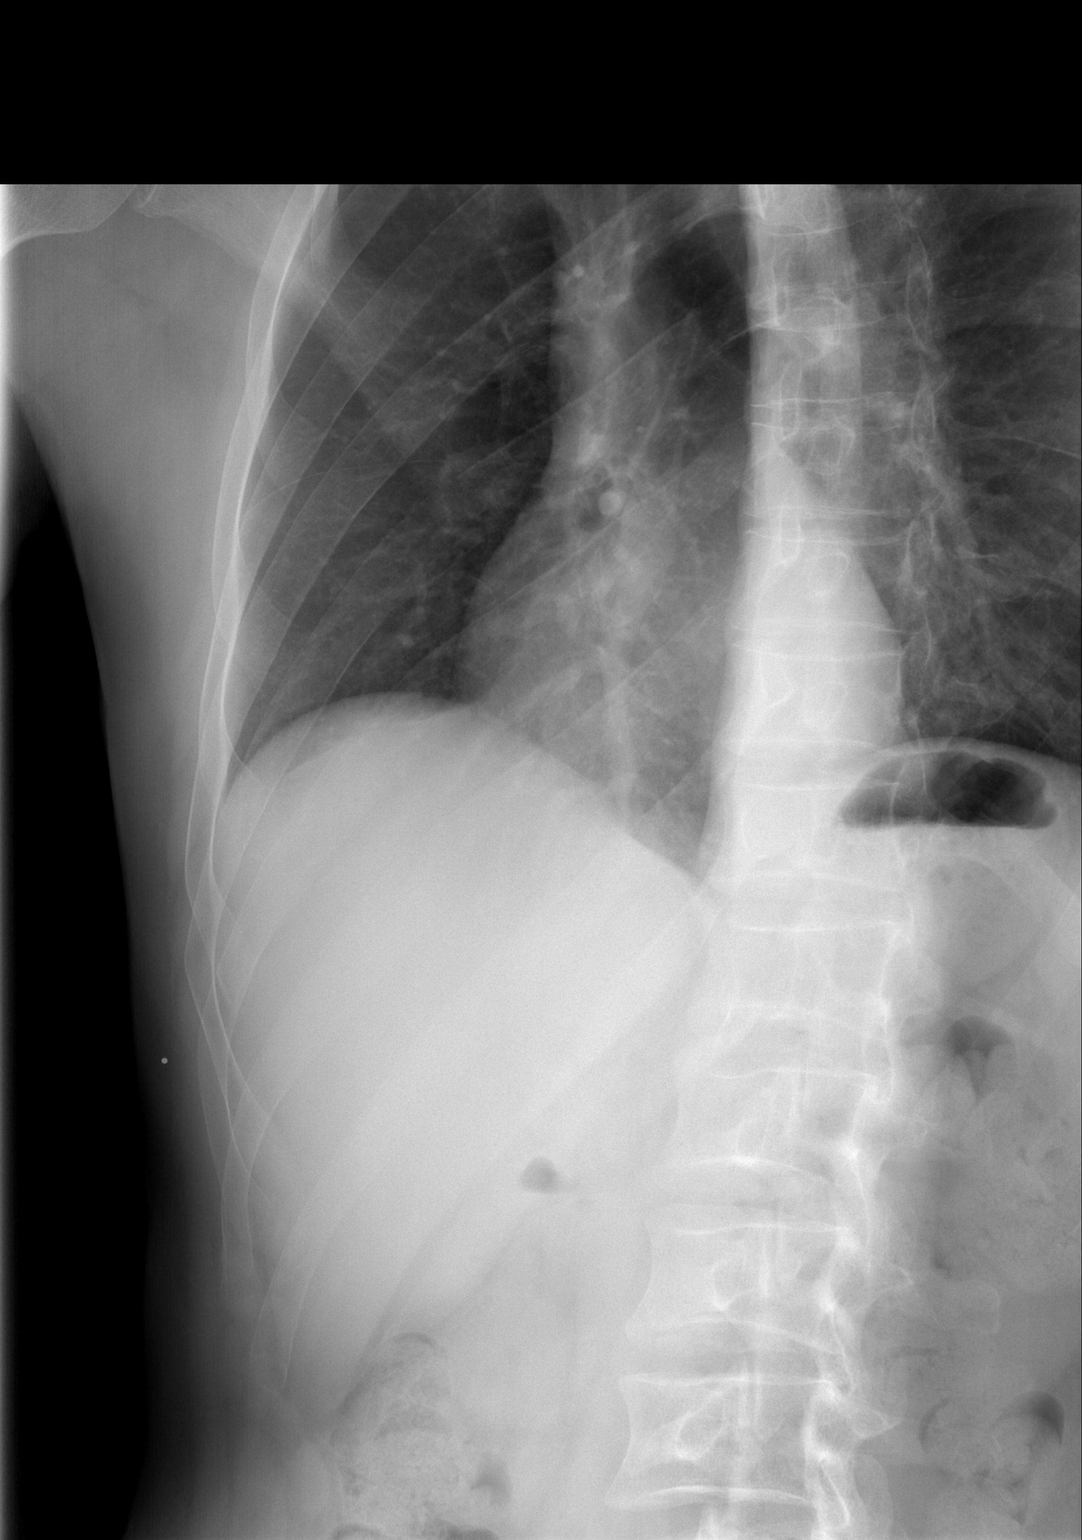

[3 of 3 positions shown; findings below may reference images not displayed]

FINDINGS: No fracture or other bone lesions are seen involving the ribs. There
is no evidence of pneumothorax or pleural effusion. Both lungs are
clear. Heart size and mediastinal contours are within normal limits.
IMPRESSION: Normal right ribs.  No acute cardiopulmonary abnormality seen.
